# Patient Record
Sex: Male | Born: 1935 | Race: White | Hispanic: No | Marital: Married | Smoking: Former smoker
Health system: Southern US, Community
[De-identification: ages and names within clinical notes are randomized; demographics above are authoritative.]

## PROBLEM LIST (undated history)

## (undated) DIAGNOSIS — J45909 Unspecified asthma, uncomplicated: Secondary | ICD-10-CM

## (undated) DIAGNOSIS — I10 Essential (primary) hypertension: Secondary | ICD-10-CM

## (undated) DIAGNOSIS — I2510 Atherosclerotic heart disease of native coronary artery without angina pectoris: Secondary | ICD-10-CM

## (undated) DIAGNOSIS — N400 Enlarged prostate without lower urinary tract symptoms: Secondary | ICD-10-CM

## (undated) DIAGNOSIS — I219 Acute myocardial infarction, unspecified: Secondary | ICD-10-CM

## (undated) DIAGNOSIS — I5020 Unspecified systolic (congestive) heart failure: Secondary | ICD-10-CM

## (undated) DIAGNOSIS — J449 Chronic obstructive pulmonary disease, unspecified: Secondary | ICD-10-CM

## (undated) HISTORY — PX: CORONARY ARTERY BYPASS GRAFT: SHX141

---

## 2004-02-28 ENCOUNTER — Ambulatory Visit: Payer: Self-pay | Admitting: Gastroenterology

## 2007-12-16 ENCOUNTER — Ambulatory Visit: Payer: Self-pay | Admitting: Internal Medicine

## 2009-05-13 IMAGING — US US PELVIS LIMITED
1 series · 4 of 4 positions shown · non-contrast
Comparison: none

REASON FOR EXAM: Dysuria, bladder retention
COMMENTS:

[Series 1: us pelvis limited · 4 of 4 slices shown]
[im 1/4]
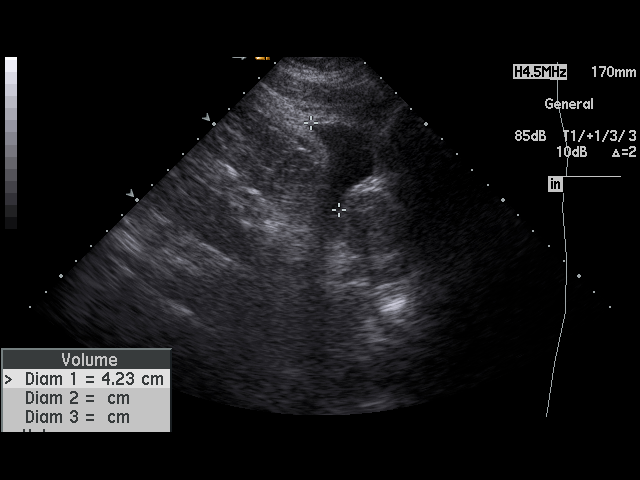
[im 2/4]
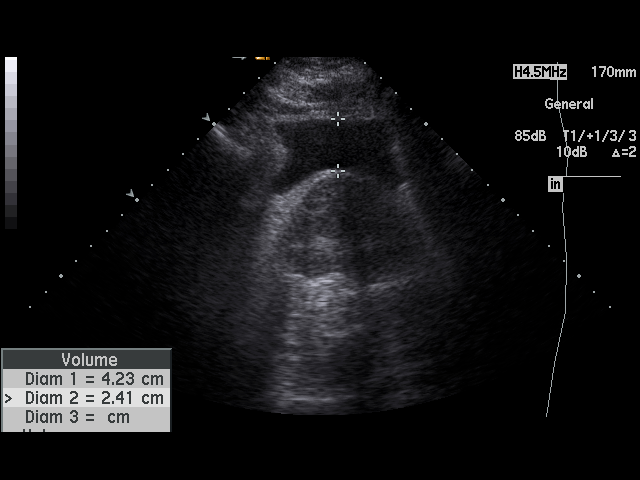
[im 3/4]
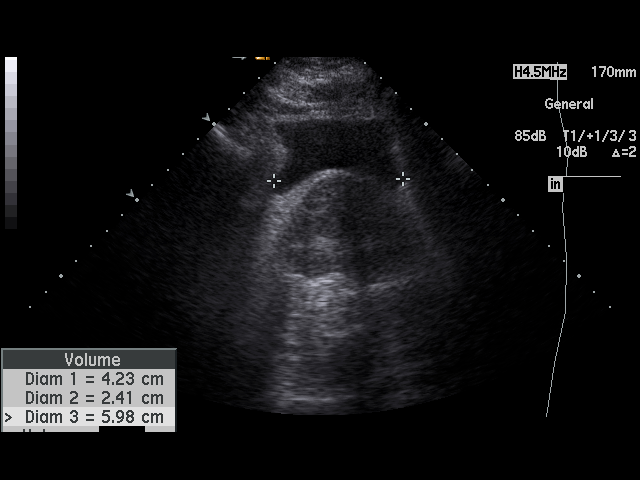
[im 4/4]
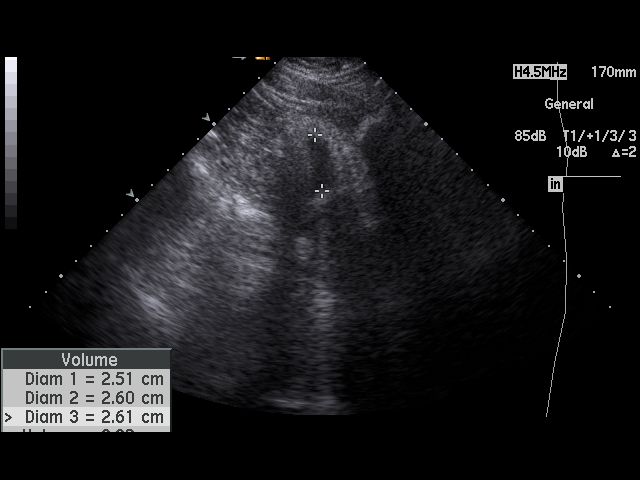

[4 of 4 positions shown; findings below may reference images not displayed]

PROCEDURE:     US  - US URINARY BLADDER  - December 16, 2007  [DATE]

RESULT:     The urinary bladder was not full. The patient reported having
drunk 32 ounces of fluid and consequently the exam was delayed 30 minutes
for the bladder to fill. At that time, the bladder is still noted to be
incompletely filled but the patient did not have additional time to wait.
Bladder volume (pre-void) measured 31.92 cubic centimeters. Post void volume
of the urinary bladder measured 8.92 cubic centimeters. No significant
abnormalities of the partially filled urinary bladder wall are seen.
IMPRESSION: Please see above.

## 2009-08-08 ENCOUNTER — Ambulatory Visit: Payer: Self-pay | Admitting: Cardiology

## 2012-07-11 ENCOUNTER — Emergency Department: Payer: Self-pay | Admitting: Emergency Medicine

## 2013-10-08 ENCOUNTER — Ambulatory Visit: Payer: Self-pay | Admitting: Gastroenterology

## 2019-12-06 ENCOUNTER — Other Ambulatory Visit: Payer: Self-pay

## 2019-12-06 ENCOUNTER — Other Ambulatory Visit: Payer: Self-pay | Admitting: Cardiology

## 2019-12-06 ENCOUNTER — Other Ambulatory Visit
Admission: RE | Admit: 2019-12-06 | Discharge: 2019-12-06 | Disposition: A | Discharge status: Home / Self Care | Payer: Medicare Other | Source: Ambulatory Visit | Attending: Internal Medicine | Admitting: Internal Medicine

## 2019-12-06 DIAGNOSIS — Z20822 Contact with and (suspected) exposure to covid-19: Secondary | ICD-10-CM | POA: Insufficient documentation

## 2019-12-06 DIAGNOSIS — Z01818 Encounter for other preprocedural examination: Secondary | ICD-10-CM | POA: Insufficient documentation

## 2019-12-06 DIAGNOSIS — I25708 Atherosclerosis of coronary artery bypass graft(s), unspecified, with other forms of angina pectoris: Secondary | ICD-10-CM

## 2019-12-06 LAB — SARS CORONAVIRUS 2 (TAT 6-24 HRS): SARS Coronavirus 2: NEGATIVE

## 2019-12-06 MED ORDER — SODIUM CHLORIDE 0.9% FLUSH: 3.0000 mL | Freq: Two times a day (BID) | INTRAVENOUS | Status: DC

## 2019-12-08 ENCOUNTER — Other Ambulatory Visit: Payer: Self-pay

## 2019-12-08 ENCOUNTER — Encounter: Payer: Self-pay | Admitting: Internal Medicine

## 2019-12-08 ENCOUNTER — Observation Stay
Admission: RE | Admit: 2019-12-08 | Discharge: 2019-12-09 | Disposition: A | Discharge status: Home / Self Care | Payer: Medicare Other | Attending: Internal Medicine | Admitting: Internal Medicine

## 2019-12-08 ENCOUNTER — Encounter
Admission: RE | Disposition: A | Discharge status: Home / Self Care | Payer: Self-pay | Source: Home / Self Care | Attending: Internal Medicine

## 2019-12-08 DIAGNOSIS — I25119 Atherosclerotic heart disease of native coronary artery with unspecified angina pectoris: Principal | ICD-10-CM | POA: Insufficient documentation

## 2019-12-08 DIAGNOSIS — I25708 Atherosclerosis of coronary artery bypass graft(s), unspecified, with other forms of angina pectoris: Secondary | ICD-10-CM

## 2019-12-08 DIAGNOSIS — Z79899 Other long term (current) drug therapy: Secondary | ICD-10-CM | POA: Insufficient documentation

## 2019-12-08 DIAGNOSIS — I119 Hypertensive heart disease without heart failure: Secondary | ICD-10-CM | POA: Diagnosis not present

## 2019-12-08 DIAGNOSIS — R079 Chest pain, unspecified: Secondary | ICD-10-CM

## 2019-12-08 DIAGNOSIS — I255 Ischemic cardiomyopathy: Secondary | ICD-10-CM | POA: Insufficient documentation

## 2019-12-08 DIAGNOSIS — Z87891 Personal history of nicotine dependence: Secondary | ICD-10-CM | POA: Diagnosis not present

## 2019-12-08 DIAGNOSIS — J45909 Unspecified asthma, uncomplicated: Secondary | ICD-10-CM | POA: Diagnosis not present

## 2019-12-08 DIAGNOSIS — R0789 Other chest pain: Secondary | ICD-10-CM | POA: Diagnosis present

## 2019-12-08 DIAGNOSIS — J449 Chronic obstructive pulmonary disease, unspecified: Secondary | ICD-10-CM | POA: Insufficient documentation

## 2019-12-08 DIAGNOSIS — E785 Hyperlipidemia, unspecified: Secondary | ICD-10-CM | POA: Diagnosis not present

## 2019-12-08 DIAGNOSIS — Z955 Presence of coronary angioplasty implant and graft: Secondary | ICD-10-CM

## 2019-12-08 HISTORY — DX: Benign prostatic hyperplasia without lower urinary tract symptoms: N40.0

## 2019-12-08 HISTORY — DX: Essential (primary) hypertension: I10

## 2019-12-08 HISTORY — DX: Unspecified asthma, uncomplicated: J45.909

## 2019-12-08 HISTORY — PX: LEFT HEART CATH AND CORONARY ANGIOGRAPHY: CATH118249

## 2019-12-08 HISTORY — DX: Chronic obstructive pulmonary disease, unspecified: J44.9

## 2019-12-08 HISTORY — PX: CORONARY STENT INTERVENTION: CATH118234

## 2019-12-08 LAB — POCT ACTIVATED CLOTTING TIME: Activated Clotting Time: 318 seconds

## 2019-12-08 SURGERY — LEFT HEART CATH AND CORONARY ANGIOGRAPHY
Anesthesia: Moderate Sedation

## 2019-12-08 MED ORDER — ASPIRIN 81 MG PO CHEW
CHEWABLE_TABLET | ORAL | Status: DC | PRN
  Administered 2019-12-08: 243 mg via ORAL

## 2019-12-08 MED ORDER — HYDROCHLOROTHIAZIDE 25 MG PO TABS: 25.0000 mg | ORAL_TABLET | Freq: Every day | ORAL | Status: DC

## 2019-12-08 MED ORDER — HYDRALAZINE HCL 20 MG/ML IJ SOLN: 10.0000 mg | INTRAMUSCULAR | Status: AC | PRN

## 2019-12-08 MED ORDER — SODIUM CHLORIDE 0.9 % IV SOLN: 250.0000 mL | INTRAVENOUS | Status: DC | PRN

## 2019-12-08 MED ORDER — METOPROLOL SUCCINATE ER 50 MG PO TB24: 25.0000 mg | ORAL_TABLET | Freq: Every day | ORAL | Status: DC

## 2019-12-08 MED ORDER — SODIUM CHLORIDE 0.9 % IV SOLN
INTRAVENOUS | Status: AC | PRN
  Administered 2019-12-08: 1.75 mg/kg/h via INTRAVENOUS

## 2019-12-08 MED ORDER — BIVALIRUDIN TRIFLUOROACETATE 250 MG IV SOLR
INTRAVENOUS | Status: AC
  Filled 2019-12-08: qty 250

## 2019-12-08 MED ORDER — SODIUM CHLORIDE 0.9% FLUSH: 3.0000 mL | INTRAVENOUS | Status: DC | PRN

## 2019-12-08 MED ORDER — PANTOPRAZOLE SODIUM 40 MG IV SOLR
40.0000 mg | Freq: Two times a day (BID) | INTRAVENOUS | Status: DC
  Filled 2019-12-08 (×4): qty 40

## 2019-12-08 MED ORDER — PANTOPRAZOLE SODIUM 40 MG IV SOLR: 40.0000 mg | Freq: Two times a day (BID) | INTRAVENOUS | Status: DC

## 2019-12-08 MED ORDER — FENTANYL CITRATE (PF) 100 MCG/2ML IJ SOLN
INTRAMUSCULAR | Status: DC | PRN
Start: 2019-12-08 — End: 2019-12-08
  Administered 2019-12-08: 50 ug via INTRAVENOUS
  Administered 2019-12-08: 25 ug via INTRAVENOUS

## 2019-12-08 MED ORDER — CLOPIDOGREL BISULFATE 75 MG PO TABS
75.0000 mg | ORAL_TABLET | Freq: Every day | ORAL | Status: DC
  Administered 2019-12-09: 75 mg via ORAL
  Filled 2019-12-08: qty 1

## 2019-12-08 MED ORDER — PANTOPRAZOLE SODIUM 40 MG IV SOLR
40.0000 mg | Freq: Two times a day (BID) | INTRAVENOUS | Status: DC
  Administered 2019-12-08: 40 mg via INTRAVENOUS
  Filled 2019-12-08: qty 40

## 2019-12-08 MED ORDER — LABETALOL HCL 5 MG/ML IV SOLN: 10.0000 mg | INTRAVENOUS | Status: AC | PRN

## 2019-12-08 MED ORDER — BIVALIRUDIN BOLUS VIA INFUSION - CUPID
INTRAVENOUS | Status: DC | PRN
  Administered 2019-12-08: 52.725 mg via INTRAVENOUS

## 2019-12-08 MED ORDER — SODIUM CHLORIDE 0.9 % WEIGHT BASED INFUSION: 1.0000 mL/kg/h | INTRAVENOUS | Status: DC

## 2019-12-08 MED ORDER — ASPIRIN 81 MG PO CHEW: 81.0000 mg | CHEWABLE_TABLET | ORAL | Status: DC

## 2019-12-08 MED ORDER — IOHEXOL 300 MG/ML  SOLN
INTRAMUSCULAR | Status: DC | PRN
  Administered 2019-12-08: 250 mL

## 2019-12-08 MED ORDER — HEPARIN (PORCINE) IN NACL 1000-0.9 UT/500ML-% IV SOLN
INTRAVENOUS | Status: AC
  Filled 2019-12-08: qty 1000

## 2019-12-08 MED ORDER — ALBUTEROL SULFATE HFA 108 (90 BASE) MCG/ACT IN AERS
2.0000 | INHALATION_SPRAY | Freq: Four times a day (QID) | RESPIRATORY_TRACT | Status: DC | PRN
  Filled 2019-12-08: qty 6.7

## 2019-12-08 MED ORDER — MIDAZOLAM HCL 2 MG/2ML IJ SOLN
INTRAMUSCULAR | Status: AC
  Filled 2019-12-08: qty 2

## 2019-12-08 MED ORDER — MIDAZOLAM HCL 2 MG/2ML IJ SOLN
INTRAMUSCULAR | Status: DC | PRN
  Administered 2019-12-08: 1 mg via INTRAVENOUS
  Administered 2019-12-08: 0.5 mg via INTRAVENOUS

## 2019-12-08 MED ORDER — ONDANSETRON HCL 4 MG/2ML IJ SOLN: 4.0000 mg | Freq: Four times a day (QID) | INTRAMUSCULAR | Status: DC | PRN

## 2019-12-08 MED ORDER — HEPARIN (PORCINE) IN NACL 1000-0.9 UT/500ML-% IV SOLN
INTRAVENOUS | Status: DC | PRN
  Administered 2019-12-08: 500 mL

## 2019-12-08 MED ORDER — FENTANYL CITRATE (PF) 100 MCG/2ML IJ SOLN
INTRAMUSCULAR | Status: AC
  Filled 2019-12-08: qty 2

## 2019-12-08 MED ORDER — ASPIRIN 81 MG PO CHEW
CHEWABLE_TABLET | ORAL | Status: AC
  Filled 2019-12-08: qty 3

## 2019-12-08 MED ORDER — ASPIRIN 81 MG PO CHEW: 81.0000 mg | CHEWABLE_TABLET | Freq: Every day | ORAL | Status: DC

## 2019-12-08 MED ORDER — HYDRALAZINE HCL 20 MG/ML IJ SOLN
INTRAMUSCULAR | Status: DC | PRN
  Administered 2019-12-08: 10 mg via INTRAVENOUS

## 2019-12-08 MED ORDER — ASPIRIN 81 MG PO CHEW
CHEWABLE_TABLET | ORAL | Status: AC
  Filled 2019-12-08: qty 1

## 2019-12-08 MED ORDER — SODIUM CHLORIDE 0.9 % IV SOLN: INTRAVENOUS | Status: DC

## 2019-12-08 MED ORDER — LOSARTAN POTASSIUM 50 MG PO TABS: 100.0000 mg | ORAL_TABLET | Freq: Every day | ORAL | Status: DC

## 2019-12-08 MED ORDER — ACETAMINOPHEN 325 MG PO TABS: 650.0000 mg | ORAL_TABLET | ORAL | Status: DC | PRN

## 2019-12-08 MED ORDER — SODIUM CHLORIDE 0.9 % IV SOLN
0.2500 mg/kg/h | INTRAVENOUS | Status: AC
  Filled 2019-12-08: qty 250

## 2019-12-08 MED ORDER — CLOPIDOGREL BISULFATE 75 MG PO TABS
ORAL_TABLET | ORAL | Status: AC
  Filled 2019-12-08: qty 8

## 2019-12-08 MED ORDER — HYDRALAZINE HCL 20 MG/ML IJ SOLN
INTRAMUSCULAR | Status: AC
  Filled 2019-12-08: qty 1

## 2019-12-08 MED ORDER — ISOSORBIDE MONONITRATE ER 60 MG PO TB24
60.0000 mg | ORAL_TABLET | Freq: Every day | ORAL | Status: DC
  Administered 2019-12-08: 60 mg via ORAL
  Filled 2019-12-08: qty 1

## 2019-12-08 MED ORDER — CLOPIDOGREL BISULFATE 75 MG PO TABS
ORAL_TABLET | ORAL | Status: DC | PRN
  Administered 2019-12-08: 600 mg via ORAL

## 2019-12-08 MED ORDER — SODIUM CHLORIDE 0.9% FLUSH: 3.0000 mL | Freq: Two times a day (BID) | INTRAVENOUS | Status: DC

## 2019-12-08 SURGICAL SUPPLY — 19 items
BALLN TREK RX 2.25X20 (BALLOONS) ×4
BALLOON TREK RX 2.25X20 (BALLOONS) ×2 IMPLANT
CATH INFINITI 5FR JL4 (CATHETERS) ×4 IMPLANT
CATH INFINITI JR4 5F (CATHETERS) ×4 IMPLANT
CATH VISTA GUIDE 6FR JR4 SH (CATHETERS) ×4 IMPLANT
DEVICE CLOSURE MYNXGRIP 6/7F (Vascular Products) ×4 IMPLANT
KIT ENCORE 26 ADVANTAGE (KITS) ×4 IMPLANT
NEEDLE PERC 18GX7CM (NEEDLE) ×4 IMPLANT
PACK CARDIAC CATH (CUSTOM PROCEDURE TRAY) ×4 IMPLANT
PROTECTION STATION PRESSURIZED (MISCELLANEOUS) ×4
SET ATX SIMPLICITY (MISCELLANEOUS) ×4 IMPLANT
SHEATH AVANTI 5FR X 11CM (SHEATH) ×4 IMPLANT
SHEATH AVANTI 6FR X 11CM (SHEATH) ×4 IMPLANT
STATION PROTECTION PRESSURIZED (MISCELLANEOUS) ×2 IMPLANT
STENT RESOLUTE ONYX 2.25X18 (Permanent Stent) ×4 IMPLANT
STENT RESOLUTE ONYX 2.25X30 (Permanent Stent) ×4 IMPLANT
STENT RESOLUTE ONYX 2.25X38 (Permanent Stent) IMPLANT
WIRE G HI TQ BMW 190 (WIRE) ×4 IMPLANT
WIRE GUIDERIGHT .035X150 (WIRE) ×4 IMPLANT

## 2019-12-08 NOTE — Progress Notes (Signed)
Dr. Juliann Pares spoke at length at bedside to patient and daughter:  Jesus Stuart. Reviewed procedure, follow up care. Patient and daughter with many questions, all answered by Dr. Juliann Pares.

## 2019-12-08 NOTE — Progress Notes (Signed)
   12/08/19 1852  Assess: MEWS Score  Temp 97.6 F (36.4 C)  BP (!) 163/75  Pulse Rate (!) 51  Resp 16  SpO2 100 %  O2 Device Room Air   Pt arrived to room 255. Pt made aware that he has to lay flat until 1900 and can not get up until 2000.

## 2019-12-08 NOTE — Progress Notes (Signed)
Dr Juliann Pares sent a message to charge RN Allyson with this RN present that pt can sit up 1 hour after angiomax completed and OOB 2 hours post. Angiomax stopped at 1800. Report given to floor that pt will be able to sit up at 1900 and OOB at 2000.

## 2019-12-08 NOTE — Progress Notes (Signed)
Per Dr. Encarnacion Chu cath x1

## 2019-12-08 NOTE — Progress Notes (Signed)
Report called to [place] RN 2A.  When angiomax is completed pt is able to be transferred.  Patient will need to continue to stay flat for 2hrs after.

## 2019-12-09 ENCOUNTER — Encounter: Payer: Self-pay | Admitting: Internal Medicine

## 2019-12-09 DIAGNOSIS — I25119 Atherosclerotic heart disease of native coronary artery with unspecified angina pectoris: Secondary | ICD-10-CM | POA: Diagnosis not present

## 2019-12-09 LAB — URINALYSIS, COMPLETE (UACMP) WITH MICROSCOPIC
Bacteria, UA: NONE SEEN
RBC / HPF: >50 RBC/hpf (ref 0–5)
Specific Gravity, Urine: >1.035 — ABNORMAL HIGH (ref 1.005–1.030)
Squamous Epithelial / HPF: NONE SEEN (ref 0–5)
WBC, UA: NONE SEEN WBC/hpf (ref 0–5)

## 2019-12-09 LAB — BASIC METABOLIC PANEL
Anion gap: 10 (ref 5–15)
BUN: 18 mg/dL (ref 8–23)
CO2: 23 mmol/L (ref 22–32)
Calcium: 8.8 mg/dL — ABNORMAL LOW (ref 8.9–10.3)
Chloride: 104 mmol/L (ref 98–111)
Creatinine, Ser: 1.13 mg/dL (ref 0.61–1.24)
GFR, Estimated: >60 mL/min (ref >60)
Glucose, Bld: 111 mg/dL — ABNORMAL HIGH (ref 70–99)
Potassium: 4.3 mmol/L (ref 3.5–5.1)
Sodium: 137 mmol/L (ref 135–145)

## 2019-12-09 MED ORDER — ATORVASTATIN CALCIUM 20 MG PO TABS: 40.0000 mg | ORAL_TABLET | Freq: Every day | ORAL | Status: DC

## 2019-12-09 MED ORDER — ATORVASTATIN CALCIUM 40 MG PO TABS
40.0000 mg | ORAL_TABLET | Freq: Every day | ORAL | 11 refills | Status: DC
Start: 2019-12-09 — End: 2023-06-30

## 2019-12-09 NOTE — H&P (Signed)
Chief Complaint: Chief Complaint  Patient presents with  . 4 week follow up  doing better  Date of Service: 12/01/2019 Date of Birth: 1931-01-24 PCP: Allyn Kenner, MD   History of Present Illness: Mr. Jesus Stuart is a 86 y.o.male patient with a past medical history significant for coronary artery disease s/p bypass, status post coronary bypass grafting x4 with a LIMA to the LAD, saphenous vein graft to ramus intermedius, saphenous vein graft OM 2, saphenous vein graft to distal RCA per Dr. Santina Evans in 1997, ischemic cardiomyopathy with an ejection fraction of 40%, COPD, and hypertension who presents for a follow up visit. He has been noting progressive exertional chest discomfort. Functional study done recently showed anteroapical largely fixed but some reversibility. He has dramatically had to reduce his activity due to chest discomfort which resolves with rest and recent reoccurs with more activity. Echocardiogram showed reduced LV function of 40% which was similar to previous ejection fractions. There was apical akinesis but no obvious thrombus noted. There was no sustained tachycardia or bradycardia arrhythmia although the patient did have runs of SVT on a Holter monitor. Risk benefits of left heart cath were discussed due to progressive symptoms despite attempt at medical management.  Past Medical and Surgical History  Past Medical History Past Medical History:  Diagnosis Date  . Asthma without status asthmaticus, unspecified  . Atherosclerotic heart disease native coronary artery w/angina pectoris (CMS-HCC)  Recent cardiac cath 06/20/95 showed an EF of 49% with anterior and apical akinesis. 50% proximal  . BPH (benign prostatic hypertrophy)  . Chronic diarrhea, unspecified  Evaluation per Dr. Henrene Hawking showed no significant abnormalities thus far  . COPD (chronic obstructive pulmonary disease) (CMS-HCC)  . Diverticulosis 10/08/13  . Heart attack (CMS-HCC) 1989  .  Hypertensive cardiovascular disease  . Ischemic cardiomyopathy  . Reactive airway disease  . Systemic primary arterial hypertension   Past Surgical History He has a past surgical history that includes Coronary artery bypass graft (1997); Cholecystectomy; Colonoscopy (02/03/1996, 02/28/2004); Cardiac cath (06/20/1995); Angioplasty, s/p MI (1989); and Colonoscopy (10/08/13).   Medications and Allergies  Current Medications  Current Outpatient Medications  Medication Sig Dispense Refill  . aspirin 81 MG EC tablet Take 81 mg by mouth once daily.  . budesonide-formoterol (SYMBICORT) 80-4.5 mcg/actuation inhaler Inhale 2 inhalations into the lungs 2 (two) times daily as needed.  . clopidogreL (PLAVIX) 75 mg tablet TAKE 1 TABLET BY MOUTH DAILY 90 tablet 1  . isosorbide mononitrate (IMDUR) 60 MG ER tablet Take 1 tablet (60 mg total) by mouth once daily 30 tablet 11  . losartan-hydrochlorothiazide (HYZAAR) 100-25 mg tablet TAKE 1 TABLET BY MOUTH DAILY 90 tablet 1  . metoprolol succinate (TOPROL-XL) 25 MG XL tablet Take 1 tablet (25 mg total) by mouth once daily (Patient taking differently: Take 12.5 mg by mouth once daily ) 30 tablet 11  . nitroGLYcerin (NITROSTAT) 0.4 MG SL tablet PLACE 1 TABLET UNDER THE TONGUE EVERY 5 MINS AS NEEDED FOR CHEST PAIN MAY TAKE UP TO 3 DOSES 25 tablet 2  . pantoprazole (PROTONIX) 40 MG DR tablet Take 1 tablet (40 mg total) by mouth 2 (two) times daily before meals 180 tablet 11  . PROAIR HFA 90 mcg/actuation inhaler 9   No current facility-administered medications for this visit.   Allergies: Penicillin g and Statins-hmg-coa reductase inhibitors  Social and Family History  Social History reports that he quit smoking about 61 years ago. He has never used smokeless tobacco. He reports current alcohol  use. He reports that he does not use drugs.  Family History Family History  Problem Relation Age of Onset  . Stroke Mother   Review of Systems   Review of  Systems: The patient denies chest pain, shortness of breath, orthopnea, paroxysmal nocturnal dyspnea, pedal edema, palpitations, heart racing, syncope, leg pain, leg cramping. Does have occasional dizziness when standing up quickly along with lightheadedness. Border line presyncope. Review of 12 Systems is negative except as described in HPI.   Physical Examination   Vitals:BP 120/64  Pulse 91  Resp 16  Ht 175.3 cm (5\' 9" )  Wt 73 kg (161 lb)  SpO2 97%  BMI 23.78 kg/m  Ht:175.3 cm (5\' 9" ) Wt:73 kg (161 lb) surface area is 1.89 meters squared. Body mass index is 23.78 kg/m.  General: Well developed, well nourished. In no acute distress HEENT: Pupils equally reactive to light and accomodation  Neck: Supple without thyromegaly, or goiter. Carotid pulses 2+. No carotid bruits present.  Pulmonary: Clear to auscultation bilaterally; no wheezes, rales, rhonchi Cardiovascular: Regular rate and rhythm. No gallops, murmurs or rubs Gastrointestinal: Soft nontender, nondistended, with normal bowel sounds Extremities: No cyanosis, clubbing, or edema Peripheral Pulses: 2+ in upper extremities, 2+ in lower extremities  Neurology: Alert and oriented X3 Pysch: Good affect. Responds appropriately  Assessment   86 y.o. male with  1. Cardiomyopathy, ischemic-EF 40% by recent echo. This is been chronic. No recent change. We discussed the importance of a low-sodium diet as well as compliance with his current medical regimen including spironolactone 25 mg daily, metoprolol succinate 25 mg daily, losartan-hydrochlorothiazide 100-25 mg daily. Will continue with this regimen and follow-up for further symptoms.  2. Coronary artery disease involving native coronary artery of native heart with angina pectoris (CMS-HCC)-status post coronary artery bypass grafting. Currently on aspirin and Plavix along with metoprolol. We will continue with this regimen and follow. Symptoms of not improved with increasing  nitrates. Limited by blood pressure. Risk and benefits of left heart cath were explained. Plan to proceed with left heart cath per Dr. FXT:KWIO. There will be further recommendations after this is completed.  3. Chronic bronchitis, unspecified chronic bronchitis type (CMS-HCC)-bronchodilators as needed.  4. Benign hypertension with CKD (chronic kidney disease) stage III (CMS-HCC)-reviewed labs done by his primary care physician. Creatinine increased however still has a GFR of 48. We will continue to follow. Hydration was recommended.  5. Resting tremor-stable  6. Dizzy-orthostatic some extent. Has improved since altering his antihypertensive regimen. We will continue to follow for worsening. No significant rhythm causing his symptoms thus far.   Plan  1. Coronary artery disease s/p bypass -Continue aspirin 81mg  daily and Plavix -Continue losartan-HCTZ 100-25mg  daily. LDL of less than 100 is recommended. Patient is statin intolerant. No clinical or functional study evidence of ischemia. We will continue with current medical regimen but proceed with cardiac cath to better evaluate etiology of his symptoms.  2. Hypertension  -We will remain on metoprolol succinate 25 mg daily as well as losartan-hydrochlorothiazide and spironolactone and follow. Follow for orthostatic blood pressure.  -Low sodium diet encouraged  3. COPD -Continue with bronchodilators and routine follow up with PCP  4. Ischemic cardiomyopathy -Continue current medication regimen except for the above changes. -Will continue to follow. EF 40%. 5. Erectile dysfunction -Sildenafil 20mg  as needed; do not use Nitro, however have instructed how to use this.

## 2019-12-09 NOTE — Final Progress Note (Signed)
Physician Final Progress Note  Patient ID: FORREST JAROSZEWSKI MRN: [id number] DOB/AGE: 1930/09/25 86 y.o.  Admit date: 12/08/2019 Admitting provider: Alwyn Pea, MD Discharge date: 12/09/2019   Admission Diagnoses: Coronary artery disease  Discharge Diagnoses:  Active Problems:   S/P drug eluting coronary stent placement Hyperlipidemia, coronary artery disease  Consults: None  Significant Findings/ Diagnostic Studies: Coronary artery disease  Procedures: Left heart cath with PCI of the RCA with drug-eluting stents x2  Discharge Condition: good  Disposition:   Diet: Cardiac diet  Discharge Activity: No lifting, driving, or strenuous exercise for 4 days  Discharge Instructions    AMB Referral to Cardiac Rehabilitation - Phase II   Complete by: As directed    Diagnosis: Coronary Stents   After initial evaluation and assessments completed: Virtual Based Care may be provided alone or in conjunction with Phase 2 Cardiac Rehab based on patient barriers.: Yes     Allergies as of 12/09/2019      Reactions   Penicillin G Rash      Medication List    TAKE these medications   albuterol 108 (90 Base) MCG/ACT inhaler Commonly known as: VENTOLIN HFA Inhale 1-2 puffs into the lungs every 6 (six) hours as needed for wheezing or shortness of breath.   aspirin EC 81 MG tablet Take 81 mg by mouth at bedtime. Swallow whole.   atorvastatin 40 MG tablet Commonly known as: LIPITOR Take 1 tablet (40 mg total) by mouth daily.   budesonide-formoterol 160-4.5 MCG/ACT inhaler Commonly known as: SYMBICORT Inhale 2 puffs into the lungs 2 (two) times daily as needed (respiratory issues.).   clopidogrel 75 MG tablet Commonly known as: PLAVIX Take 75 mg by mouth at bedtime.   isosorbide mononitrate 60 MG 24 hr tablet Commonly known as: IMDUR Take 60 mg by mouth every evening.   losartan-hydrochlorothiazide 100-25 MG tablet Commonly known as: HYZAAR Take 1 tablet by mouth  every evening.   metoprolol succinate 25 MG 24 hr tablet Commonly known as: TOPROL-XL Take 25 mg by mouth every evening.   nitroGLYCERIN 0.4 MG SL tablet Commonly known as: NITROSTAT Place 0.4 mg under the tongue every 5 (five) minutes x 3 doses as needed for chest pain.   pantoprazole 40 MG tablet Commonly known as: PROTONIX Take 40 mg by mouth at bedtime.       Follow-up Information    Dalia Heading, MD Follow up in 1 week(s).   Specialty: Cardiology Contact information: [address] [place] [address] [phone]               Total time spent taking care of this patient: 30 minutes  Signed: Dalia Heading 12/09/2019, 8:30 AM

## 2019-12-09 NOTE — Discharge Summary (Signed)
Physician Discharge Summary  [name] MRN: [id number] DOB/AGE: 84/84/1932 84 y.o.  Admit date: 12/08/2019 Discharge date: 12/09/2019  Primary Discharge Diagnosis coronary artery disease Secondary Discharge Diagnosis hyperlipidemia  Significant Diagnostic Studies: angiography: cardiac cath and pci of rca and yes  Consults: None  Hospital Course: Patient with history of coronary artery disease status post coronary artery bypass grafting with development of worsening exertional chest pain.  Attempted medical management was unsuccessful.  Patient was scheduled for outpatient cardiac catheterization.  This revealed occluded vein graft to RCA.  Patent LIMA to the LAD.  Underwent PCI with overlapping drug-eluting stents in the native RCA.  Was monitored post PCI and has done well.  No chest pain.  Cath site appears clean and dry.  Distal pulses intact.   Discharge Exam: Blood pressure 132/65, pulse (!) 47, temperature 97.7 F (36.5 C), temperature source Oral, resp. rate 18, height 5\' 9"  (1.753 m), weight 72.2 kg, SpO2 99 %.   General appearance: alert and cooperative Head: Normocephalic, without obvious abnormality, atraumatic Resp: clear to auscultation bilaterally Cardio: regular rate and rhythm, S1, S2 normal, no murmur, click, rub or gallop GI: soft, non-tender; bowel sounds normal; no masses,  no organomegaly Extremities: extremities normal, atraumatic, no cyanosis or edema Pulses: 2+ and symmetric Neurologic: Grossly normal Incision/Wound: Labs:  No results found for: WBC, HGB, HCT, MCV, PLT No results for input(s): NA, K, CL, CO2, BUN, CREATININE, CALCIUM, PROT, BILITOT, ALKPHOS, ALT, AST, GLUCOSE in the last 168 hours.  Invalid input(s): LABALBU    Radiology:   EKG: Normal sinus rhythm with no ischemia  FOLLOW UP PLANS AND APPOINTMENTS Discharge Instructions    AMB Referral to Cardiac Rehabilitation - Phase II   Complete by: As directed     Diagnosis: Coronary Stents   After initial evaluation and assessments completed: Virtual Based Care may be provided alone or in conjunction with Phase 2 Cardiac Rehab based on patient barriers.: Yes     Allergies as of 12/09/2019      Reactions   Penicillin G Rash      Medication List    TAKE these medications   albuterol 108 (90 Base) MCG/ACT inhaler Commonly known as: VENTOLIN HFA Inhale 1-2 puffs into the lungs every 6 (six) hours as needed for wheezing or shortness of breath.   aspirin EC 81 MG tablet Take 81 mg by mouth at bedtime. Swallow whole.   atorvastatin 40 MG tablet Commonly known as: LIPITOR Take 1 tablet (40 mg total) by mouth daily.   budesonide-formoterol 160-4.5 MCG/ACT inhaler Commonly known as: SYMBICORT Inhale 2 puffs into the lungs 2 (two) times daily as needed (respiratory issues.).   clopidogrel 75 MG tablet Commonly known as: PLAVIX Take 75 mg by mouth at bedtime.   isosorbide mononitrate 60 MG 24 hr tablet Commonly known as: IMDUR Take 60 mg by mouth every evening.   losartan-hydrochlorothiazide 100-25 MG tablet Commonly known as: HYZAAR Take 1 tablet by mouth every evening.   metoprolol succinate 25 MG 24 hr tablet Commonly known as: TOPROL-XL Take 25 mg by mouth every evening.   nitroGLYCERIN 0.4 MG SL tablet Commonly known as: NITROSTAT Place 0.4 mg under the tongue every 5 (five) minutes x 3 doses as needed for chest pain.   pantoprazole 40 MG tablet Commonly known as: PROTONIX Take 40 mg by mouth at bedtime.       Follow-up Information    12/11/2019, MD Follow up in 1 week(s).  Specialty: Cardiology Contact information: [address] [place] [address] [phone]               BRING ALL MEDICATIONS WITH YOU TO FOLLOW UP APPOINTMENTS  Time spent with patient to include physician time: 30 minutes Signed:  Dalia Heading MD, [facility] 12/09/2019, 8:27 AM

## 2019-12-09 NOTE — Progress Notes (Signed)
Pt ambulated around nurses station x1 and tolerated well. No distress noted.

## 2019-12-09 NOTE — Progress Notes (Signed)
Jesus Stuart to be D/C'd Home per MD order.  Discussed prescriptions and follow up appointments with the patient. Prescriptions given to patient, medication list explained in detail. Pt verbalized understanding.  Allergies as of 12/09/2019      Reactions   Penicillin G Rash      Medication List    TAKE these medications   albuterol 108 (90 Base) MCG/ACT inhaler Commonly known as: VENTOLIN HFA Inhale 1-2 puffs into the lungs every 6 (six) hours as needed for wheezing or shortness of breath.   aspirin EC 81 MG tablet Take 81 mg by mouth at bedtime. Swallow whole.   atorvastatin 40 MG tablet Commonly known as: LIPITOR Take 1 tablet (40 mg total) by mouth daily.   budesonide-formoterol 160-4.5 MCG/ACT inhaler Commonly known as: SYMBICORT Inhale 2 puffs into the lungs 2 (two) times daily as needed (respiratory issues.).   clopidogrel 75 MG tablet Commonly known as: PLAVIX Take 75 mg by mouth at bedtime.   isosorbide mononitrate 60 MG 24 hr tablet Commonly known as: IMDUR Take 60 mg by mouth every evening.   losartan-hydrochlorothiazide 100-25 MG tablet Commonly known as: HYZAAR Take 1 tablet by mouth every evening.   metoprolol succinate 25 MG 24 hr tablet Commonly known as: TOPROL-XL Take 25 mg by mouth every evening.   nitroGLYCERIN 0.4 MG SL tablet Commonly known as: NITROSTAT Place 0.4 mg under the tongue every 5 (five) minutes x 3 doses as needed for chest pain.   pantoprazole 40 MG tablet Commonly known as: PROTONIX Take 40 mg by mouth at bedtime.       Vitals:   12/09/19 0807 12/09/19 1115  BP: 132/65 (!) 144/62  Pulse: (!) 47 (!) 54  Resp: 18 18  Temp: 97.7 F (36.5 C) 98.6 F (37 C)  SpO2: 99% 100%    Tele box removed and returned. Skin clean, dry and intact without evidence of skin break down, no evidence of skin tears noted. IV catheter discontinued intact. Site without signs and symptoms of complications. Dressing and pressure applied. Pt  denies pain at this time. No complaints noted.  An After Visit Summary was printed and given to the patient. Patient escorted via WC, and D/C home via private auto.  Rigoberto Noel

## 2019-12-13 ENCOUNTER — Emergency Department: Payer: Medicare Other

## 2019-12-13 ENCOUNTER — Other Ambulatory Visit: Payer: Self-pay

## 2019-12-13 ENCOUNTER — Inpatient Hospital Stay
Admission: EM | Admit: 2019-12-13 | Discharge: 2019-12-18 | DRG: 872 | Disposition: A | Discharge status: Home Health | Payer: Medicare Other | Attending: Internal Medicine | Admitting: Internal Medicine

## 2019-12-13 ENCOUNTER — Encounter: Payer: Self-pay | Admitting: Emergency Medicine

## 2019-12-13 DIAGNOSIS — I25119 Atherosclerotic heart disease of native coronary artery with unspecified angina pectoris: Secondary | ICD-10-CM | POA: Diagnosis not present

## 2019-12-13 DIAGNOSIS — Z955 Presence of coronary angioplasty implant and graft: Secondary | ICD-10-CM | POA: Diagnosis not present

## 2019-12-13 DIAGNOSIS — N189 Chronic kidney disease, unspecified: Secondary | ICD-10-CM | POA: Diagnosis present

## 2019-12-13 DIAGNOSIS — E871 Hypo-osmolality and hyponatremia: Secondary | ICD-10-CM | POA: Diagnosis present

## 2019-12-13 DIAGNOSIS — I2510 Atherosclerotic heart disease of native coronary artery without angina pectoris: Secondary | ICD-10-CM | POA: Diagnosis present

## 2019-12-13 DIAGNOSIS — J449 Chronic obstructive pulmonary disease, unspecified: Secondary | ICD-10-CM | POA: Diagnosis present

## 2019-12-13 DIAGNOSIS — A4151 Sepsis due to Escherichia coli [E. coli]: Principal | ICD-10-CM | POA: Diagnosis present

## 2019-12-13 DIAGNOSIS — B9620 Unspecified Escherichia coli [E. coli] as the cause of diseases classified elsewhere: Secondary | ICD-10-CM | POA: Diagnosis present

## 2019-12-13 DIAGNOSIS — I10 Essential (primary) hypertension: Secondary | ICD-10-CM | POA: Diagnosis not present

## 2019-12-13 DIAGNOSIS — N390 Urinary tract infection, site not specified: Secondary | ICD-10-CM | POA: Diagnosis present

## 2019-12-13 DIAGNOSIS — Z87891 Personal history of nicotine dependence: Secondary | ICD-10-CM | POA: Diagnosis not present

## 2019-12-13 DIAGNOSIS — Z7982 Long term (current) use of aspirin: Secondary | ICD-10-CM

## 2019-12-13 DIAGNOSIS — Z7951 Long term (current) use of inhaled steroids: Secondary | ICD-10-CM

## 2019-12-13 DIAGNOSIS — N179 Acute kidney failure, unspecified: Secondary | ICD-10-CM | POA: Diagnosis present

## 2019-12-13 DIAGNOSIS — A419 Sepsis, unspecified organism: Secondary | ICD-10-CM

## 2019-12-13 DIAGNOSIS — E876 Hypokalemia: Secondary | ICD-10-CM | POA: Diagnosis not present

## 2019-12-13 DIAGNOSIS — Z951 Presence of aortocoronary bypass graft: Secondary | ICD-10-CM | POA: Diagnosis not present

## 2019-12-13 DIAGNOSIS — A4150 Gram-negative sepsis, unspecified: Secondary | ICD-10-CM | POA: Diagnosis not present

## 2019-12-13 DIAGNOSIS — Z7902 Long term (current) use of antithrombotics/antiplatelets: Secondary | ICD-10-CM

## 2019-12-13 DIAGNOSIS — Z79899 Other long term (current) drug therapy: Secondary | ICD-10-CM

## 2019-12-13 DIAGNOSIS — E860 Dehydration: Secondary | ICD-10-CM | POA: Diagnosis present

## 2019-12-13 DIAGNOSIS — N400 Enlarged prostate without lower urinary tract symptoms: Secondary | ICD-10-CM | POA: Diagnosis present

## 2019-12-13 DIAGNOSIS — I129 Hypertensive chronic kidney disease with stage 1 through stage 4 chronic kidney disease, or unspecified chronic kidney disease: Secondary | ICD-10-CM | POA: Diagnosis present

## 2019-12-13 DIAGNOSIS — Z880 Allergy status to penicillin: Secondary | ICD-10-CM | POA: Diagnosis not present

## 2019-12-13 DIAGNOSIS — Z20822 Contact with and (suspected) exposure to covid-19: Secondary | ICD-10-CM | POA: Diagnosis present

## 2019-12-13 DIAGNOSIS — R6520 Severe sepsis without septic shock: Secondary | ICD-10-CM | POA: Diagnosis not present

## 2019-12-13 DIAGNOSIS — R001 Bradycardia, unspecified: Secondary | ICD-10-CM | POA: Diagnosis not present

## 2019-12-13 DIAGNOSIS — R7881 Bacteremia: Secondary | ICD-10-CM | POA: Diagnosis not present

## 2019-12-13 DIAGNOSIS — I25708 Atherosclerosis of coronary artery bypass graft(s), unspecified, with other forms of angina pectoris: Secondary | ICD-10-CM

## 2019-12-13 LAB — CBC WITH DIFFERENTIAL/PLATELET
Abs Immature Granulocytes: 0.06 10*3/uL (ref 0.00–0.07)
Basophils Absolute: 0 10*3/uL (ref 0.0–0.1)
Basophils Relative: 1 %
Eosinophils Absolute: 0 10*3/uL (ref 0.0–0.5)
Eosinophils Relative: 0 %
HCT: 37.8 % — ABNORMAL LOW (ref 39.0–52.0)
Hemoglobin: 13.2 g/dL (ref 13.0–17.0)
Immature Granulocytes: 1 %
Lymphocytes Relative: 3 %
Lymphs Abs: 0.2 10*3/uL — ABNORMAL LOW (ref 0.7–4.0)
MCH: 32.6 pg (ref 26.0–34.0)
MCHC: 34.9 g/dL (ref 30.0–36.0)
MCV: 93.3 fL (ref 80.0–100.0)
Monocytes Absolute: 0.6 10*3/uL (ref 0.1–1.0)
Monocytes Relative: 8 %
Neutro Abs: 7.1 10*3/uL (ref 1.7–7.7)
Neutrophils Relative %: 87 %
Platelets: 262 10*3/uL (ref 150–400)
RBC: 4.05 MIL/uL — ABNORMAL LOW (ref 4.22–5.81)
RDW: 13.2 % (ref 11.5–15.5)
WBC: 8.1 10*3/uL (ref 4.0–10.5)
nRBC: 0 % (ref 0.0–0.2)

## 2019-12-13 LAB — COMPREHENSIVE METABOLIC PANEL
ALT: 17 U/L (ref 0–44)
AST: 25 U/L (ref 15–41)
Albumin: 3.6 g/dL (ref 3.5–5.0)
Alkaline Phosphatase: 66 U/L (ref 38–126)
Anion gap: 13 (ref 5–15)
BUN: 32 mg/dL — ABNORMAL HIGH (ref 8–23)
CO2: 18 mmol/L — ABNORMAL LOW (ref 22–32)
Calcium: 8.2 mg/dL — ABNORMAL LOW (ref 8.9–10.3)
Chloride: 99 mmol/L (ref 98–111)
Creatinine, Ser: 2.49 mg/dL — ABNORMAL HIGH (ref 0.61–1.24)
GFR, Estimated: 25 mL/min — ABNORMAL LOW (ref >60)
Glucose, Bld: 143 mg/dL — ABNORMAL HIGH (ref 70–99)
Potassium: 3.1 mmol/L — ABNORMAL LOW (ref 3.5–5.1)
Sodium: 130 mmol/L — ABNORMAL LOW (ref 135–145)
Total Bilirubin: 1.7 mg/dL — ABNORMAL HIGH (ref 0.3–1.2)
Total Protein: 7.1 g/dL (ref 6.5–8.1)

## 2019-12-13 LAB — URINALYSIS, COMPLETE (UACMP) WITH MICROSCOPIC
Bilirubin Urine: NEGATIVE
Glucose, UA: NEGATIVE mg/dL
Ketones, ur: NEGATIVE mg/dL
Nitrite: NEGATIVE
Protein, ur: 100 mg/dL — AB
Specific Gravity, Urine: 1.012 (ref 1.005–1.030)
WBC, UA: >50 WBC/hpf — ABNORMAL HIGH (ref 0–5)
pH: 5 (ref 5.0–8.0)

## 2019-12-13 LAB — RESPIRATORY PANEL BY RT PCR (FLU A&B, COVID)
Influenza A by PCR: NEGATIVE
Influenza B by PCR: NEGATIVE
SARS Coronavirus 2 by RT PCR: NEGATIVE

## 2019-12-13 LAB — PROTIME-INR
INR: 1.1 (ref 0.8–1.2)
Prothrombin Time: 13.3 seconds (ref 11.4–15.2)

## 2019-12-13 LAB — TROPONIN I (HIGH SENSITIVITY)
Troponin I (High Sensitivity): 54 ng/L — ABNORMAL HIGH (ref <18)
Troponin I (High Sensitivity): 44 ng/L — ABNORMAL HIGH (ref <18)

## 2019-12-13 LAB — LACTIC ACID, PLASMA
Lactic Acid, Venous: 2.3 mmol/L (ref 0.5–1.9)
Lactic Acid, Venous: 3.1 mmol/L (ref 0.5–1.9)

## 2019-12-13 MED ORDER — LACTATED RINGERS IV BOLUS
1000.0000 mL | Freq: Once | INTRAVENOUS | Status: AC
  Administered 2019-12-13: 1000 mL via INTRAVENOUS

## 2019-12-13 MED ORDER — LACTATED RINGERS IV SOLN: INTRAVENOUS | Status: AC

## 2019-12-13 MED ORDER — SODIUM CHLORIDE 0.9 % IV SOLN
1.0000 g | INTRAVENOUS | Status: DC
  Administered 2019-12-13: 1 g via INTRAVENOUS
  Filled 2019-12-13: qty 10

## 2019-12-13 MED ORDER — LACTATED RINGERS IV BOLUS (SEPSIS)
1000.0000 mL | Freq: Once | INTRAVENOUS | Status: AC
  Administered 2019-12-13: 1000 mL via INTRAVENOUS

## 2019-12-13 MED ORDER — LACTATED RINGERS IV BOLUS
250.0000 mL | Freq: Once | INTRAVENOUS | Status: AC
  Administered 2019-12-13: 250 mL via INTRAVENOUS

## 2019-12-13 NOTE — ED Notes (Signed)
Pt given water at this time 

## 2019-12-13 NOTE — ED Provider Notes (Signed)
[facility] Emergency Department Provider Note   ____________________________________________   First MD Initiated Contact with Patient 12/13/19 1757     (approximate)  I have reviewed the triage vital signs and the nursing notes.   HISTORY  Chief Complaint Chills and Fatigue    HPI Jesus Stuart is a 86 y.o. male with past medical history of hypertension, CAD, COPD, and BPH who presents to the ED complaining of fever and chills.  Patient reports that he has been dealing with dysuria ever since he had a cardiac catheterization and stent placement 5 days ago.  He was experiencing some dyspnea on exertion prior to stent placement, states this now feels better.  He developed fever and chills 2 days ago and has started feeling weaker than usual.  He denies any cough, chest pain, shortness of breath, abdominal pain, vomiting, or diarrhea.  He states the catheterization site in his right groin has been healing well with no redness, warmth, or pain.        Past Medical History:  Diagnosis Date  . BPH (benign prostatic hyperplasia)   . COPD (chronic obstructive pulmonary disease) (HCC)   . Hypertension   . Reactive airway disease     Patient Active Problem List   Diagnosis Date Noted  . S/P drug eluting coronary stent placement 12/08/2019    Past Surgical History:  Procedure Laterality Date  . CORONARY ARTERY BYPASS GRAFT    . CORONARY STENT INTERVENTION N/A 12/08/2019   Procedure: CORONARY STENT INTERVENTION;  Surgeon: Alwyn Pea, MD;  Location: [facility] INVASIVE CV LAB;  Service: Cardiovascular;  Laterality: N/A;  . LEFT HEART CATH AND CORONARY ANGIOGRAPHY Left 12/08/2019   Procedure: LEFT HEART CATH AND CORONARY ANGIOGRAPHY;  Surgeon: Alwyn Pea, MD;  Location: [facility] INVASIVE CV LAB;  Service: Cardiovascular;  Laterality: Left;    Prior to Admission medications   Medication Sig Start Date End Date Taking? Authorizing Provider   albuterol (VENTOLIN HFA) 108 (90 Base) MCG/ACT inhaler Inhale 1-2 puffs into the lungs every 6 (six) hours as needed for wheezing or shortness of breath. 10/16/19   [provider]  aspirin EC 81 MG tablet Take 81 mg by mouth at bedtime. Swallow whole.    [provider]  atorvastatin (LIPITOR) 40 MG tablet Take 1 tablet (40 mg total) by mouth daily. 12/09/19   Dalia Heading, MD  budesonide-formoterol (SYMBICORT) 160-4.5 MCG/ACT inhaler Inhale 2 puffs into the lungs 2 (two) times daily as needed (respiratory issues.).    [provider]  clopidogrel (PLAVIX) 75 MG tablet Take 75 mg by mouth at bedtime. 09/27/19   [provider]  isosorbide mononitrate (IMDUR) 60 MG 24 hr tablet Take 60 mg by mouth every evening. 11/17/19   [provider]  losartan-hydrochlorothiazide (HYZAAR) 100-25 MG tablet Take 1 tablet by mouth every evening. 11/18/19   [provider]  metoprolol succinate (TOPROL-XL) 25 MG 24 hr tablet Take 25 mg by mouth every evening. 11/16/19   [provider]  nitroGLYCERIN (NITROSTAT) 0.4 MG SL tablet Place 0.4 mg under the tongue every 5 (five) minutes x 3 doses as needed for chest pain. 10/22/19   [provider]  pantoprazole (PROTONIX) 40 MG tablet Take 40 mg by mouth at bedtime. 11/03/19   [provider]    Allergies Penicillin g  History reviewed. No pertinent family history.  Social History Social History   Tobacco Use  . Smoking status: Former Games developer  .  Smokeless tobacco: Never Used  . Tobacco comment: quit 86yrs ago  Vaping Use  . Vaping Use: Never used  Substance Use Topics  . Alcohol use: Not on file  . Drug use: Never    Review of Systems  Constitutional: Positive for fever/chills Eyes: No visual changes. ENT: No sore throat. Cardiovascular: Denies chest pain. Respiratory: Denies shortness of breath. Gastrointestinal: No abdominal pain.  No nausea, no vomiting.  No diarrhea.  No  constipation. Genitourinary: Positive for dysuria. Musculoskeletal: Negative for back pain. Skin: Negative for rash. Neurological: Negative for headaches, focal weakness or numbness.  ____________________________________________   PHYSICAL EXAM:  VITAL SIGNS: ED Triage Vitals  Enc Vitals Group     BP 12/13/19 1713 (!) 99/53     Pulse Rate 12/13/19 1713 89     Resp 12/13/19 1713 (!) 26     Temp 12/13/19 1713 100.2 F (37.9 C)     Temp Source 12/13/19 1713 Oral     SpO2 12/13/19 1713 94 %     Weight 12/13/19 1714 159 lb 2.8 oz (72.2 kg)     Height 12/13/19 1714 5\' 9"  (1.753 m)     Head Circumference --      Peak Flow --      Pain Score 12/13/19 1714 0     Pain Loc --      Pain Edu? --      Excl. in GC? --     Constitutional: Alert and oriented. Eyes: Conjunctivae are normal. Head: Atraumatic. Nose: No congestion/rhinnorhea. Mouth/Throat: Mucous membranes are moist. Neck: Normal ROM Cardiovascular: Normal rate, regular rhythm. Grossly normal heart sounds.  Catheterization site to right groin clean, dry, and intact with no erythema, warmth, or tenderness. Respiratory: Normal respiratory effort.  No retractions. Lungs CTAB. Gastrointestinal: Soft and nontender. No distention. Genitourinary: deferred Musculoskeletal: No lower extremity tenderness nor edema. Neurologic:  Normal speech and language. No gross focal neurologic deficits are appreciated. Skin:  Skin is warm, dry and intact. No rash noted. Psychiatric: Mood and affect are normal. Speech and behavior are normal.  ____________________________________________   LABS (all labs ordered are listed, but only abnormal results are displayed)  Labs Reviewed  COMPREHENSIVE METABOLIC PANEL - Abnormal; Notable for the following components:      Result Value   Sodium 130 (*)    Potassium 3.1 (*)    CO2 18 (*)    Glucose, Bld 143 (*)    BUN 32 (*)    Creatinine, Ser 2.49 (*)    Calcium 8.2 (*)    Total Bilirubin 1.7  (*)    GFR, Estimated 25 (*)    All other components within normal limits  LACTIC ACID, PLASMA - Abnormal; Notable for the following components:   Lactic Acid, Venous 2.3 (*)    All other components within normal limits  CBC WITH DIFFERENTIAL/PLATELET - Abnormal; Notable for the following components:   RBC 4.05 (*)    HCT 37.8 (*)    Lymphs Abs 0.2 (*)    All other components within normal limits  URINALYSIS, COMPLETE (UACMP) WITH MICROSCOPIC - Abnormal; Notable for the following components:   Color, Urine AMBER (*)    APPearance CLOUDY (*)    Hgb urine dipstick SMALL (*)    Protein, ur 100 (*)    Leukocytes,Ua MODERATE (*)    WBC, UA >50 (*)    Bacteria, UA RARE (*)    All other components within normal limits  TROPONIN I (HIGH SENSITIVITY) - Abnormal;  Notable for the following components:   Troponin I (High Sensitivity) 54 (*)    All other components within normal limits  RESPIRATORY PANEL BY RT PCR (FLU A&B, COVID)  CULTURE, BLOOD (ROUTINE X 2)  CULTURE, BLOOD (ROUTINE X 2)  URINE CULTURE  PROTIME-INR  LACTIC ACID, PLASMA  TROPONIN I (HIGH SENSITIVITY)   ____________________________________________  EKG  ED ECG REPORT I, Chesley Noon, the attending physician, personally viewed and interpreted this ECG.   Date: 12/13/2019  EKG Time: 17:19  Rate: 89  Rhythm: normal sinus rhythm  Axis: Normal  Intervals:right bundle branch block  ST&T Change: None   PROCEDURES  Procedure(s) performed (including Critical Care):  .Critical Care Performed by: Chesley Noon, MD Authorized by: Chesley Noon, MD   Critical care provider statement:    Critical care time (minutes):  45   Critical care time was exclusive of:  Separately billable procedures and treating other patients and teaching time   Critical care was necessary to treat or prevent imminent or life-threatening deterioration of the following conditions:  Sepsis   Critical care was time spent personally by me  on the following activities:  Discussions with consultants, evaluation of patient's response to treatment, examination of patient, ordering and performing treatments and interventions, ordering and review of laboratory studies, ordering and review of radiographic studies, pulse oximetry, re-evaluation of patient's condition, obtaining history from patient or surrogate and review of old charts   I assumed direction of critical care for this patient from another provider in my specialty: no       ____________________________________________   INITIAL IMPRESSION / ASSESSMENT AND PLAN / ED COURSE       86 year old male with past medical history of hypertension, CAD, COPD, and BPH who presents to the ED complaining of dysuria for the past 5 days and chills developing 2 days ago.  He was noted to have a temperature of 102.3 with EMS, subsequently 100.2 here in the ED.  Combined with mild tachypnea and likely UTI, presentation is concerning for sepsis.  We will start antibiotics and hydrate patient with IV fluids, lactate mildly elevated and we will trend.  Chest x-ray is negative for acute process, no evidence of infiltrate or edema by my read, and patient denies any intra-abdominal symptoms.  Lab work does show significant AKI, likely related to sepsis.  Blood pressure is improving with IV fluids, patient remains awake and alert and in no respiratory distress.  UA confirms infection, which we have treated with Rocephin.  COVID-19 testing is negative.  Case discussed with hospitalist for admission.      ____________________________________________   FINAL CLINICAL IMPRESSION(S) / ED DIAGNOSES  Final diagnoses:  Sepsis with acute renal failure without septic shock, due to unspecified organism, unspecified acute renal failure type [facility])  Lower urinary tract infectious disease     ED Discharge Orders    None       Note:  This document was prepared using Dragon voice recognition software  and may include unintentional dictation errors.   Chesley Noon, MD 12/13/19 [phone]

## 2019-12-13 NOTE — Progress Notes (Signed)
CODE SEPSIS - PHARMACY COMMUNICATION  **Broad Spectrum Antibiotics should be administered within 1 hour of Sepsis diagnosis**  Time Code Sepsis Called/Page Received: 1835  Antibiotics Ordered: Ceftriaxone  Time of 1st antibiotic administration: 1843  Additional action taken by pharmacy: N/A  Tressie Ellis 12/13/2019  6:41 PM

## 2019-12-13 NOTE — ED Notes (Signed)
Date and time results received: 12/13/19    Test: lactic acid Critical Value: 2.3  Name of Provider Notified: Dr. Larinda Buttery

## 2019-12-13 NOTE — ED Triage Notes (Signed)
Pt comes into the ED via EMS from home with c/o fever ,fatigue and cold chills , had cardiac stent last week was started on lipitor. 102.9 temp, 97%RA, RR 20,

## 2019-12-13 NOTE — H&P (Signed)
History and Physical   Jesus Stuart ZOX:[id number] DOB: 08-04-1930 DOA: 12/13/2019  Referring MD/NP/PA: Dr. Larinda ButteryJessup  PCP: Lauro RegulusAnderson, Marshall W, MD   Outpatient Specialists: Dr. Harold HedgeFath, Kenneth, cardiology  Patient coming from: Home  Chief Complaint: Fatigue and chills  HPI: Jesus Stuart is a 86 y.o. male with medical history significant of COPD, hypertension, BPH, coronary artery disease, recent cardiac cath with stent placement about 5 days ago who presented with dysuria fever and chills.  Patient also had temperature up to 102.5 at home as recorded by EMS.  He was seen in the ER where he was evaluated.  He appears to be dehydrated.  No evidence of infection at the catheter site.  Patient reports of the dysuria and frequency.  This started about 2 days ago.  Urinalysis consistent with UTI.  He also meets sepsis criteria so is being admitted with sepsis secondary to UTI and patient being evaluated and treated as such..  ED Course: Temperature 100.2 blood pressure 99/53 pulse 103 respirate of 30 oxygen sat 94% room air.  White count 8.1 hemoglobin 13.1 platelets 262.  Sodium is 130 potassium 3.0 chloride 99 CO2 of 18 BUN 32 creatinine 2.49 and calcium 8.2.  Lactic acid 3.1 and glucose 143.  Urinalysis showed cloudy urine with moderate leukocytes.  WBC more than 50 g bacteria.  Troponin is 44.  Chest x-ray showed no evidence of acute cardiopulmonary abnormality.  Patient being admitted with sepsis secondary to UTI.  Review of Systems: As per HPI otherwise 10 point review of systems negative.    Past Medical History:  Diagnosis Date  . BPH (benign prostatic hyperplasia)   . COPD (chronic obstructive pulmonary disease) (HCC)   . Hypertension   . Reactive airway disease     Past Surgical History:  Procedure Laterality Date  . CORONARY ARTERY BYPASS GRAFT    . CORONARY STENT INTERVENTION N/A 12/08/2019   Procedure: CORONARY STENT INTERVENTION;  Surgeon: Alwyn Peaallwood, Dwayne D, MD;   Location: [facility] INVASIVE CV LAB;  Service: Cardiovascular;  Laterality: N/A;  . LEFT HEART CATH AND CORONARY ANGIOGRAPHY Left 12/08/2019   Procedure: LEFT HEART CATH AND CORONARY ANGIOGRAPHY;  Surgeon: Alwyn Peaallwood, Dwayne D, MD;  Location: [facility] INVASIVE CV LAB;  Service: Cardiovascular;  Laterality: Left;     reports that he has quit smoking. He has never used smokeless tobacco. He reports that he does not use drugs. No history on file for alcohol use.  Allergies  Allergen Reactions  . Penicillin G Rash    History reviewed. No pertinent family history.   Prior to Admission medications   Medication Sig Start Date End Date Taking? Authorizing Provider  albuterol (VENTOLIN HFA) 108 (90 Base) MCG/ACT inhaler Inhale 1-2 puffs into the lungs every 6 (six) hours as needed for wheezing or shortness of breath. 10/16/19  Yes [provider]  aspirin EC 81 MG tablet Take 81 mg by mouth at bedtime. Swallow whole.   Yes [provider]  atorvastatin (LIPITOR) 40 MG tablet Take 1 tablet (40 mg total) by mouth daily. 12/09/19  Yes Dalia HeadingFath, Kenneth A, MD  budesonide-formoterol (SYMBICORT) 160-4.5 MCG/ACT inhaler Inhale 2 puffs into the lungs 2 (two) times daily as needed (respiratory issues.).   Yes [provider]  clopidogrel (PLAVIX) 75 MG tablet Take 75 mg by mouth at bedtime. 09/27/19  Yes [provider]  isosorbide mononitrate (IMDUR) 60 MG 24 hr tablet Take 60 mg by mouth every evening. 11/17/19  Yes [provider]  losartan-hydrochlorothiazide (HYZAAR) 100-25 MG tablet Take 1 tablet by mouth every evening. 11/18/19  Yes [provider]  metoprolol succinate (TOPROL-XL) 25 MG 24 hr tablet Take 25 mg by mouth every evening. 11/16/19  Yes [provider]  nitroGLYCERIN (NITROSTAT) 0.4 MG SL tablet Place 0.4 mg under the tongue every 5 (five) minutes x 3 doses as needed for chest pain. 10/22/19   [provider]  pantoprazole (PROTONIX) 40 MG  tablet Take 40 mg by mouth at bedtime. Patient not taking: Reported on 12/13/2019 11/03/19   [provider]  spironolactone (ALDACTONE) 25 MG tablet Take 25 mg by mouth daily. 12/07/19   [provider]    Physical Exam: Vitals:   12/13/19 2035 12/13/19 2045 12/13/19 2045 12/13/19 2244  BP:  (!) 168/121 (!) 168/121 (!) 112/49  Pulse: (!) 103  (!) 103 81  Resp: (!) 27  (!) 28 (!) 30  Temp: 98.1 F (36.7 C)  98.1 F (36.7 C) 98.1 F (36.7 C)  TempSrc: Oral   Oral  SpO2: 100%  100% 100%  Weight:      Height:          Constitutional: Acutely ill looking, no distress Vitals:   12/13/19 2035 12/13/19 2045 12/13/19 2045 12/13/19 2244  BP:  (!) 168/121 (!) 168/121 (!) 112/49  Pulse: (!) 103  (!) 103 81  Resp: (!) 27  (!) 28 (!) 30  Temp: 98.1 F (36.7 C)  98.1 F (36.7 C) 98.1 F (36.7 C)  TempSrc: Oral   Oral  SpO2: 100%  100% 100%  Weight:      Height:       Eyes: PERRL, lids and conjunctivae normal ENMT: Mucous membranes are moist. Posterior pharynx clear of any exudate or lesions.Normal dentition.  Neck: normal, supple, no masses, no thyromegaly Respiratory: clear to auscultation bilaterally, no wheezing, no crackles. Normal respiratory effort. No accessory muscle use.  Cardiovascular: Sinus tachycardia, no murmurs / rubs / gallops. No extremity edema. 2+ pedal pulses. No carotid bruits.  Abdomen: no tenderness, no masses palpated. No hepatosplenomegaly. Bowel sounds positive.  Musculoskeletal: no clubbing / cyanosis. No joint deformity upper and lower extremities. Good ROM, no contractures. Normal muscle tone.  Skin: no rashes, lesions, ulcers. No induration Neurologic: CN 2-12 grossly intact. Sensation intact, DTR normal. Strength 5/5 in all 4.  Psychiatric: Normal judgment and insight. Alert and oriented x 3. Normal mood.     Labs on Admission: I have personally reviewed following labs and imaging studies  CBC: Recent Labs  Lab 12/13/19 1722    WBC 8.1  NEUTROABS 7.1  HGB 13.2  HCT 37.8*  MCV 93.3  PLT 262   Basic Metabolic Panel: Recent Labs  Lab 12/09/19 0855 12/13/19 1722  NA 137 130*  K 4.3 3.1*  CL 104 99  CO2 23 18*  GLUCOSE 111* 143*  BUN 18 32*  CREATININE 1.13 2.49*  CALCIUM 8.8* 8.2*   GFR: Estimated Creatinine Clearance: 21.3 mL/min (A) (by C-G formula based on SCr of 2.49 mg/dL (H)). Liver Function Tests: Recent Labs  Lab 12/13/19 1722  AST 25  ALT 17  ALKPHOS 66  BILITOT 1.7*  PROT 7.1  ALBUMIN 3.6   No results for input(s): LIPASE, AMYLASE in the last 168 hours. No results for input(s): AMMONIA in the last 168 hours. Coagulation Profile: Recent Labs  Lab 12/13/19 1722  INR 1.1   Cardiac Enzymes: No results for input(s): CKTOTAL, CKMB, CKMBINDEX, TROPONINI in the last 168  hours. BNP (last 3 results) No results for input(s): PROBNP in the last 8760 hours. HbA1C: No results for input(s): HGBA1C in the last 72 hours. CBG: No results for input(s): GLUCAP in the last 168 hours. Lipid Profile: No results for input(s): CHOL, HDL, LDLCALC, TRIG, CHOLHDL, LDLDIRECT in the last 72 hours. Thyroid Function Tests: No results for input(s): TSH, T4TOTAL, FREET4, T3FREE, THYROIDAB in the last 72 hours. Anemia Panel: No results for input(s): VITAMINB12, FOLATE, FERRITIN, TIBC, IRON, RETICCTPCT in the last 72 hours. Urine analysis:    Component Value Date/Time   COLORURINE AMBER (A) 12/13/2019 1819   APPEARANCEUR CLOUDY (A) 12/13/2019 1819   LABSPEC 1.012 12/13/2019 1819   PHURINE 5.0 12/13/2019 1819   GLUCOSEU NEGATIVE 12/13/2019 1819   HGBUR SMALL (A) 12/13/2019 1819   BILIRUBINUR NEGATIVE 12/13/2019 1819   KETONESUR NEGATIVE 12/13/2019 1819   PROTEINUR 100 (A) 12/13/2019 1819   NITRITE NEGATIVE 12/13/2019 1819   LEUKOCYTESUR MODERATE (A) 12/13/2019 1819   Sepsis Labs: (procalcitonin:4,lacticidven:4) ) Recent Results (from the past 240 hour(s))  SARS CORONAVIRUS 2 (TAT 6-24  HRS) Nasopharyngeal Nasopharyngeal Swab     Status: None   Collection Time: 12/06/19 11:30 AM   Specimen: Nasopharyngeal Swab  Result Value Ref Range Status   SARS Coronavirus 2 NEGATIVE NEGATIVE Final    Comment: (NOTE) SARS-CoV-2 target nucleic acids are NOT DETECTED.  The SARS-CoV-2 RNA is generally detectable in upper and lower respiratory specimens during the acute phase of infection. Negative results do not preclude SARS-CoV-2 infection, do not rule out co-infections with other pathogens, and should not be used as the sole basis for treatment or other patient management decisions. Negative results must be combined with clinical observations, patient history, and epidemiological information. The expected result is Negative.  Fact Sheet for Patients: HairSlick.no  Fact Sheet for Healthcare Providers: quierodirigir.com  This test is not yet approved or cleared by the [place] FDA and  has been authorized for detection and/or diagnosis of SARS-CoV-2 by FDA under an Emergency Use Authorization (EUA). This EUA will remain  in effect (meaning this test can be used) for the duration of the COVID-19 declaration under Se ction 564(b)(1) of the Act, 21 U.S.C. section 360bbb-3(b)(1), unless the authorization is terminated or revoked sooner.  Performed at [facility], [address] [place], [place] [address]   Respiratory Panel by RT PCR (Flu A&B, Covid) - Nasopharyngeal Swab     Status: None   Collection Time: 12/13/19  5:22 PM   Specimen: Nasopharyngeal Swab  Result Value Ref Range Status   SARS Coronavirus 2 by RT PCR NEGATIVE NEGATIVE Final    Comment: (NOTE) SARS-CoV-2 target nucleic acids are NOT DETECTED.  The SARS-CoV-2 RNA is generally detectable in upper respiratoy specimens during the acute phase of infection. The lowest concentration of SARS-CoV-2 viral copies this assay can detect is 131 copies/mL. A  negative result does not preclude SARS-Cov-2 infection and should not be used as the sole basis for treatment or other patient management decisions. A negative result may occur with  improper specimen collection/handling, submission of specimen other than nasopharyngeal swab, presence of viral mutation(s) within the areas targeted by this assay, and inadequate number of viral copies (<131 copies/mL). A negative result must be combined with clinical observations, patient history, and epidemiological information. The expected result is Negative.  Fact Sheet for Patients:  https://www.moore.com/  Fact Sheet for Healthcare Providers:  https://www.young.biz/  This test is no t yet approved or cleared by  the [employer] and  has been authorized for detection and/or diagnosis of SARS-CoV-2 by FDA under an Emergency Use Authorization (EUA). This EUA will remain  in effect (meaning this test can be used) for the duration of the COVID-19 declaration under Section 564(b)(1) of the Act, 21 U.S.C. section 360bbb-3(b)(1), unless the authorization is terminated or revoked sooner.     Influenza A by PCR NEGATIVE NEGATIVE Final   Influenza B by PCR NEGATIVE NEGATIVE Final    Comment: (NOTE) The Xpert Xpress SARS-CoV-2/FLU/RSV assay is intended as an aid in  the diagnosis of influenza from Nasopharyngeal swab specimens and  should not be used as a sole basis for treatment. Nasal washings and  aspirates are unacceptable for Xpert Xpress SARS-CoV-2/FLU/RSV  testing.  Fact Sheet for Patients: https://www.moore.com/  Fact Sheet for Healthcare Providers: https://www.young.biz/  This test is not yet approved or cleared by the [place] FDA and  has been authorized for detection and/or diagnosis of SARS-CoV-2 by  FDA under an Emergency Use Authorization (EUA). This EUA will remain  in effect (meaning this test can  be used) for the duration of the  Covid-19 declaration under Section 564(b)(1) of the Act, 21  U.S.C. section 360bbb-3(b)(1), unless the authorization is  terminated or revoked. Performed at [facility], [address] [place], [place] [address]      Radiological Exams on Admission: DG Chest [facility] 1 View  Result Date: 12/13/2019 CLINICAL DATA:  Sepsis. Additional history provided: Patient presents with fever, chills, fatigue, dizziness, cardiac stent placed last week. EXAM: PORTABLE CHEST 1 VIEW COMPARISON:  Report from prior chest radiographs 05/03/2002 (images unavailable). FINDINGS: Prior median sternotomy and CABG. Discontinuity of multiple sternal cerclage wires. Heart size at the upper limits of normal. Aortic atherosclerosis. No appreciable airspace consolidation or pulmonary edema. No evidence of pleural effusion or pneumothorax. No acute bony abnormality identified. IMPRESSION: No evidence of acute cardiopulmonary abnormality. Prior median sternotomy/CABG with discontinuity of multiple sternal cerclage wires. Heart size at the upper limits of normal. Aortic Atherosclerosis (ICD10-I70.0). Electronically Signed   By: Jackey Loge DO   On: 12/13/2019 18:23    EKG: Independently reviewed.  Sinus rhythm with no significant ST changes.  Assessment/Plan Principal Problem:   Sepsis due to gram-negative UTI (HCC) Active Problems:   COPD (chronic obstructive pulmonary disease) (HCC)   Essential hypertension   Hypokalemia   Hyponatremia   AKI (acute kidney injury) (HCC)     #1 sepsis due to UTI: Patient doing better already with some fluids.  Should be admitted for sepsis to monitored bed.  Blood and urine cultures obtained.  IV Rocephin with IV fluids.  Continue with treatment until cultures antibiotics so antibiotics can be adjusted.  #2 COPD: No acute exacerbation.  #3 hyponatremia: Probably dehydration.  Gentle saline.  #4 hypokalemia: Replete potassium.  #5 AKI:  Probably prerenal especially with recent cath.  Hydrate and monitor closely.  #6 coronary artery disease: Status post recent catheterization.  Continue to follow cardiology  #7 essential hypertension: Continue blood pressure control.   DVT prophylaxis: Lovenox Code Status: Full code Family Communication: No family at bedside Disposition Plan: Home Consults called: None Admission status: Inpatient  Severity of Illness: The appropriate patient status for this patient is INPATIENT. Inpatient status is judged to be reasonable and necessary in order to provide the required intensity of service to ensure the patient's safety. The patient's presenting symptoms, physical exam findings, and initial radiographic and laboratory data in the context  of their chronic comorbidities is felt to place them at high risk for further clinical deterioration. Furthermore, it is not anticipated that the patient will be medically stable for discharge from the hospital within 2 midnights of admission. The following factors support the patient status of inpatient.   " The patient's presenting symptoms include dysuria with fever and chills. " The worrisome physical exam findings include dry mucous membranes and tachycardia. " The initial radiographic and laboratory data are worrisome because of sepsis criteria with UTI. " The chronic co-morbidities include coronary artery disease.   * I certify that at the point of admission it is my clinical judgment that the patient will require inpatient hospital care spanning beyond 2 midnights from the point of admission due to high intensity of service, high risk for further deterioration and high frequency of surveillance required.Lonia Blood MD [facility] Pager [phone]  If 7PM-7AM, please contact night-coverage [email or web address] Password [facility]  12/13/2019, 11:53 PM

## 2019-12-13 NOTE — ED Notes (Signed)
Gave pt graham cracker and orange juice. Family at bed side.

## 2019-12-13 NOTE — ED Triage Notes (Signed)
Pt presents to ED via POV with c/o fever/chils/fatigue/dizziness. Per first RN Note pt had cardiac stent placed last week.   Pt with slight fever on arrival to ED. Pt states he did not know he had a fever prior to EMS arrival at his home.

## 2019-12-13 NOTE — Progress Notes (Signed)
Notified bedside nurse of need to draw repeat lactic acid. 

## 2019-12-14 ENCOUNTER — Inpatient Hospital Stay: Payer: Medicare Other

## 2019-12-14 DIAGNOSIS — N179 Acute kidney failure, unspecified: Secondary | ICD-10-CM | POA: Diagnosis not present

## 2019-12-14 DIAGNOSIS — E871 Hypo-osmolality and hyponatremia: Secondary | ICD-10-CM

## 2019-12-14 DIAGNOSIS — J449 Chronic obstructive pulmonary disease, unspecified: Secondary | ICD-10-CM

## 2019-12-14 DIAGNOSIS — I10 Essential (primary) hypertension: Secondary | ICD-10-CM | POA: Diagnosis not present

## 2019-12-14 DIAGNOSIS — A4150 Gram-negative sepsis, unspecified: Secondary | ICD-10-CM | POA: Diagnosis not present

## 2019-12-14 DIAGNOSIS — E876 Hypokalemia: Secondary | ICD-10-CM

## 2019-12-14 LAB — COMPREHENSIVE METABOLIC PANEL
ALT: 16 U/L (ref 0–44)
AST: 25 U/L (ref 15–41)
Albumin: 2.8 g/dL — ABNORMAL LOW (ref 3.5–5.0)
Alkaline Phosphatase: 53 U/L (ref 38–126)
Anion gap: 8 (ref 5–15)
BUN: 27 mg/dL — ABNORMAL HIGH (ref 8–23)
CO2: 22 mmol/L (ref 22–32)
Calcium: 7.7 mg/dL — ABNORMAL LOW (ref 8.9–10.3)
Chloride: 102 mmol/L (ref 98–111)
Creatinine, Ser: 2.04 mg/dL — ABNORMAL HIGH (ref 0.61–1.24)
GFR, Estimated: 31 mL/min — ABNORMAL LOW (ref >60)
Glucose, Bld: 144 mg/dL — ABNORMAL HIGH (ref 70–99)
Potassium: 3.1 mmol/L — ABNORMAL LOW (ref 3.5–5.1)
Sodium: 132 mmol/L — ABNORMAL LOW (ref 135–145)
Total Bilirubin: 1.2 mg/dL (ref 0.3–1.2)
Total Protein: 5.8 g/dL — ABNORMAL LOW (ref 6.5–8.1)

## 2019-12-14 LAB — BLOOD CULTURE ID PANEL (REFLEXED) - BCID2

## 2019-12-14 LAB — CBC
HCT: 32.9 % — ABNORMAL LOW (ref 39.0–52.0)
Hemoglobin: 11.5 g/dL — ABNORMAL LOW (ref 13.0–17.0)
MCH: 32.9 pg (ref 26.0–34.0)
MCHC: 35 g/dL (ref 30.0–36.0)
MCV: 94 fL (ref 80.0–100.0)
Platelets: 213 10*3/uL (ref 150–400)
RBC: 3.5 MIL/uL — ABNORMAL LOW (ref 4.22–5.81)
RDW: 13.2 % (ref 11.5–15.5)
WBC: 14.2 10*3/uL — ABNORMAL HIGH (ref 4.0–10.5)
nRBC: 0 % (ref 0.0–0.2)

## 2019-12-14 LAB — PROTIME-INR
INR: 1.1 (ref 0.8–1.2)
Prothrombin Time: 14.1 seconds (ref 11.4–15.2)

## 2019-12-14 LAB — PROCALCITONIN: Procalcitonin: 31.72 ng/mL

## 2019-12-14 LAB — CORTISOL-AM, BLOOD: Cortisol - AM: 25 ug/dL — ABNORMAL HIGH (ref 6.7–22.6)

## 2019-12-14 LAB — LACTIC ACID, PLASMA: Lactic Acid, Venous: 1.4 mmol/L (ref 0.5–1.9)

## 2019-12-14 MED ORDER — ACETAMINOPHEN 325 MG PO TABS
650.0000 mg | ORAL_TABLET | Freq: Four times a day (QID) | ORAL | Status: DC | PRN
  Administered 2019-12-16: 650 mg via ORAL
  Filled 2019-12-14: qty 2

## 2019-12-14 MED ORDER — METOPROLOL SUCCINATE ER 25 MG PO TB24
25.0000 mg | ORAL_TABLET | Freq: Every evening | ORAL | Status: DC
  Administered 2019-12-14: 25 mg via ORAL
  Filled 2019-12-14: qty 1

## 2019-12-14 MED ORDER — ONDANSETRON HCL 4 MG/2ML IJ SOLN: 4.0000 mg | Freq: Four times a day (QID) | INTRAMUSCULAR | Status: DC | PRN

## 2019-12-14 MED ORDER — LOSARTAN POTASSIUM 50 MG PO TABS: 100.0000 mg | ORAL_TABLET | Freq: Every day | ORAL | Status: DC

## 2019-12-14 MED ORDER — ONDANSETRON HCL 4 MG PO TABS: 4.0000 mg | ORAL_TABLET | Freq: Four times a day (QID) | ORAL | Status: DC | PRN

## 2019-12-14 MED ORDER — MOMETASONE FURO-FORMOTEROL FUM 200-5 MCG/ACT IN AERO
2.0000 | INHALATION_SPRAY | Freq: Two times a day (BID) | RESPIRATORY_TRACT | Status: DC
  Administered 2019-12-14 – 2019-12-18 (×8): 2 via RESPIRATORY_TRACT
  Filled 2019-12-14: qty 8.8

## 2019-12-14 MED ORDER — ALBUTEROL SULFATE HFA 108 (90 BASE) MCG/ACT IN AERS
1.0000 | INHALATION_SPRAY | Freq: Four times a day (QID) | RESPIRATORY_TRACT | Status: DC | PRN
  Filled 2019-12-14: qty 6.7

## 2019-12-14 MED ORDER — NITROGLYCERIN 0.4 MG SL SUBL: 0.4000 mg | SUBLINGUAL_TABLET | SUBLINGUAL | Status: DC | PRN

## 2019-12-14 MED ORDER — TAMSULOSIN HCL 0.4 MG PO CAPS
0.4000 mg | ORAL_CAPSULE | Freq: Every day | ORAL | Status: DC
  Administered 2019-12-14 – 2019-12-17 (×4): 0.4 mg via ORAL
  Filled 2019-12-14 (×4): qty 1

## 2019-12-14 MED ORDER — CLOPIDOGREL BISULFATE 75 MG PO TABS
75.0000 mg | ORAL_TABLET | Freq: Every day | ORAL | Status: DC
  Administered 2019-12-14 – 2019-12-17 (×5): 75 mg via ORAL
  Filled 2019-12-14 (×5): qty 1

## 2019-12-14 MED ORDER — SODIUM CHLORIDE 0.9% FLUSH
3.0000 mL | Freq: Two times a day (BID) | INTRAVENOUS | Status: DC
  Administered 2019-12-14 – 2019-12-17 (×5): 3 mL via INTRAVENOUS

## 2019-12-14 MED ORDER — SODIUM CHLORIDE 0.9 % IV SOLN
2.0000 g | INTRAVENOUS | Status: DC
  Administered 2019-12-14 – 2019-12-15 (×2): 2 g via INTRAVENOUS
  Filled 2019-12-14: qty 2
  Filled 2019-12-14 (×2): qty 20

## 2019-12-14 MED ORDER — POTASSIUM CHLORIDE CRYS ER 20 MEQ PO TBCR
40.0000 meq | EXTENDED_RELEASE_TABLET | Freq: Once | ORAL | Status: AC
  Administered 2019-12-14: 40 meq via ORAL
  Filled 2019-12-14: qty 2

## 2019-12-14 MED ORDER — LOSARTAN POTASSIUM-HCTZ 100-25 MG PO TABS: 1.0000 | ORAL_TABLET | Freq: Every evening | ORAL | Status: DC

## 2019-12-14 MED ORDER — ENOXAPARIN SODIUM 30 MG/0.3ML ~~LOC~~ SOLN
30.0000 mg | SUBCUTANEOUS | Status: DC
  Administered 2019-12-14: 30 mg via SUBCUTANEOUS
  Filled 2019-12-14: qty 0.3

## 2019-12-14 MED ORDER — PANTOPRAZOLE SODIUM 40 MG PO TBEC
40.0000 mg | DELAYED_RELEASE_TABLET | Freq: Every day | ORAL | Status: DC
  Administered 2019-12-14 – 2019-12-17 (×5): 40 mg via ORAL
  Filled 2019-12-14 (×5): qty 1

## 2019-12-14 MED ORDER — HYDROCHLOROTHIAZIDE 25 MG PO TABS: 25.0000 mg | ORAL_TABLET | Freq: Every day | ORAL | Status: DC

## 2019-12-14 MED ORDER — ASPIRIN EC 81 MG PO TBEC
81.0000 mg | DELAYED_RELEASE_TABLET | Freq: Every day | ORAL | Status: DC
  Administered 2019-12-14 – 2019-12-17 (×5): 81 mg via ORAL
  Filled 2019-12-14 (×5): qty 1

## 2019-12-14 MED ORDER — MELATONIN 5 MG PO TABS
5.0000 mg | ORAL_TABLET | Freq: Every day | ORAL | Status: DC
  Administered 2019-12-14 – 2019-12-17 (×4): 5 mg via ORAL
  Filled 2019-12-14 (×4): qty 1

## 2019-12-14 MED ORDER — SODIUM CHLORIDE 0.9 % IV SOLN: 1.0000 g | INTRAVENOUS | Status: DC

## 2019-12-14 MED ORDER — SPIRONOLACTONE 25 MG PO TABS
25.0000 mg | ORAL_TABLET | Freq: Every day | ORAL | Status: DC
  Filled 2019-12-14 (×2): qty 1

## 2019-12-14 MED ORDER — ATORVASTATIN CALCIUM 20 MG PO TABS
40.0000 mg | ORAL_TABLET | Freq: Every day | ORAL | Status: DC
  Administered 2019-12-14 – 2019-12-18 (×5): 40 mg via ORAL
  Filled 2019-12-14 (×5): qty 2

## 2019-12-14 MED ORDER — ISOSORBIDE MONONITRATE ER 30 MG PO TB24
60.0000 mg | ORAL_TABLET | Freq: Every evening | ORAL | Status: DC
  Administered 2019-12-14: 60 mg via ORAL
  Filled 2019-12-14: qty 2

## 2019-12-14 MED ORDER — ACETAMINOPHEN 650 MG RE SUPP: 650.0000 mg | Freq: Four times a day (QID) | RECTAL | Status: DC | PRN

## 2019-12-14 NOTE — Progress Notes (Signed)
PHARMACY - PHYSICIAN COMMUNICATION CRITICAL VALUE ALERT - BLOOD CULTURE IDENTIFICATION (BCID)  QUANTARIUS GENRICH is an 86 y.o. male who presented to [facility] on 12/13/2019  Assessment:  1/4 E.coli, no resistance detected  Name of physician (or Provider) Contacted: Manuela Schwartz, NP  Current antibiotics: Rocephin 1 g IV q24h  Changes to prescribed antibiotics recommended: increase to 2 g IV q24h  Results for orders placed or performed during the hospital encounter of 12/13/19  Blood Culture ID Panel (Reflexed) (Collected: 12/13/2019  5:22 PM)  Result Value Ref Range   Enterococcus faecalis NOT DETECTED NOT DETECTED   Enterococcus Faecium NOT DETECTED NOT DETECTED   Listeria monocytogenes NOT DETECTED NOT DETECTED   Staphylococcus species NOT DETECTED NOT DETECTED   Staphylococcus aureus (BCID) NOT DETECTED NOT DETECTED   Staphylococcus epidermidis NOT DETECTED NOT DETECTED   Staphylococcus lugdunensis NOT DETECTED NOT DETECTED   Streptococcus species NOT DETECTED NOT DETECTED   Streptococcus agalactiae NOT DETECTED NOT DETECTED   Streptococcus pneumoniae NOT DETECTED NOT DETECTED   Streptococcus pyogenes NOT DETECTED NOT DETECTED   A.calcoaceticus-baumannii NOT DETECTED NOT DETECTED   Bacteroides fragilis NOT DETECTED NOT DETECTED   Enterobacterales DETECTED (A) NOT DETECTED   Enterobacter cloacae complex NOT DETECTED NOT DETECTED   Escherichia coli DETECTED (A) NOT DETECTED   Klebsiella aerogenes NOT DETECTED NOT DETECTED   Klebsiella oxytoca NOT DETECTED NOT DETECTED   Klebsiella pneumoniae NOT DETECTED NOT DETECTED   Proteus species NOT DETECTED NOT DETECTED   Salmonella species NOT DETECTED NOT DETECTED   Serratia marcescens NOT DETECTED NOT DETECTED   Haemophilus influenzae NOT DETECTED NOT DETECTED   Neisseria meningitidis NOT DETECTED NOT DETECTED   Pseudomonas aeruginosa NOT DETECTED NOT DETECTED   Stenotrophomonas maltophilia NOT DETECTED NOT DETECTED   Candida  albicans NOT DETECTED NOT DETECTED   Candida auris NOT DETECTED NOT DETECTED   Candida glabrata NOT DETECTED NOT DETECTED   Candida krusei NOT DETECTED NOT DETECTED   Candida parapsilosis NOT DETECTED NOT DETECTED   Candida tropicalis NOT DETECTED NOT DETECTED   Cryptococcus neoformans/gattii NOT DETECTED NOT DETECTED   CTX-M ESBL NOT DETECTED NOT DETECTED   Carbapenem resistance IMP NOT DETECTED NOT DETECTED   Carbapenem resistance KPC NOT DETECTED NOT DETECTED   Carbapenem resistance NDM NOT DETECTED NOT DETECTED   Carbapenem resist OXA 48 LIKE NOT DETECTED NOT DETECTED   Carbapenem resistance VIM NOT DETECTED NOT DETECTED    Pricilla Riffle, PharmD 12/14/2019  5:22 AM

## 2019-12-14 NOTE — Progress Notes (Addendum)
PROGRESS NOTE  Jesus Stuart:[id number] DOB: Apr 03, 1930 DOA: 12/13/2019 PCP: Kirk Ruths, MD   LOS: 1 day   Brief narrative: As per HPI,  Jesus Stuart is a 86 y.o. male with medical history significant of COPD, hypertension, BPH, coronary artery disease, recent cardiac cath with stent placement about 5 days ago presented to hospital with complaints of dysuria, fever and chills with a temperature of around 102.5 F noted by EMS.  Patient met sepsis criteria so was admitted to hospital.  In the ED since initial temperature was 100.2 F with mild tachycardia and respiratory rate of 30/min.  Sodium was130 potassium 3.0 chloride 99 CO2 of 18 BUN 32 creatinine 2.49 and calcium 8.2.  Lactic acid 3.1 and glucose 143.  Urinalysis showed cloudy urine with moderate leukocytes.  Urine analysis was abnormal. Troponin was 44.  Chest x-ray showed no evidence of acute cardiopulmonary abnormality.  Patient was then admitted to hospital with sepsis secondary to UTI.  Assessment/Plan:  Principal Problem:   Sepsis due to gram-negative UTI (HCC) Active Problems:   COPD (chronic obstructive pulmonary disease) (HCC)   Essential hypertension   Hypokalemia   Hyponatremia   AKI (acute kidney injury) ([place])  Severe sepsis due to E. coli UTI:  Patient presented with signs of sepsis including leukocytosis, fever, tachypnea, elevated lactate, acute kidney injury. blood cultures showing E. coli..  On IV Rocephin.  Still very dysuric and burning micturation.  Continue to follow  urine cultures.  WBC elevated at 14.2.  Initially lactate was elevated at 3.1.  Recent lactate of 1.4.  This improved with IV fluids.  Received IV fluids.  Initial procalcitonin was elevated at 31.7.  COVID-19 and flu was negative.patient received septic bolus in the ED. Decrease IV fluid rate to 100 ml/h.  T-max of 100.2 F.  Encourage oral hydration.  We will add renal ultrasound.  We will check for bladder and prostate as  well.  Will check post void residual volume.  Unable to give Pyridium due to renal failure. Added flomax empirically.  COPD: Not in  acute exacerbation.  On Dulera.  We will continue with that.  Add as needed albuterol inhaler  Hyponatremia:  Mild.  Will closely monitor on fluid replacement.  Check BMP in a.m.  Mild hypokalemia.  We will continue to replenish.  Check levels in a.m.  Potassium 3.1 today.  We will give a 40 potassium x1 today.   AKI:  Creatinine on presentation at 2.4.  Creatinine 2.04 today.  Patient did have recent cardiac catheterization 5 days back.  Acute kidney injury likely to be secondary  to sepsis given the timeframe.  We will continue to monitor.  Monitor intake and output charting.  Continue on IV fluids with Ringer lactate.  Will decrease to 100 ml/h.  Discontinue losartan and HCTZ.  History of coronary artery disease: Status post recent catheterization 5 days back status post coronary stent..    No active chest pain at this time.  Continue to monitor closely.  Continue aspirin, Lipitor Plavix Imdur and metoprolol.  Essential hypertension: Continue metoprolol, hold losartan HCTZ and spironolactone for now.  Closely monitor blood pressure.  Add as needed antihypertensive.  DVT prophylaxis: enoxaparin (LOVENOX) injection 30 mg Start: 12/14/19 0100 SCDs Start: 12/14/19 0012   Code Status: Full code  Family Communication: I spoke with the patient's daughter on the phone and updated her about the clinical condition of the patient.   Status is: Inpatient  Remains inpatient appropriate  because:IV treatments appropriate due to intensity of illness or inability to take PO and Inpatient level of care appropriate due to severity of illness   Dispo: The patient is from: Home              Anticipated d/c is to: Home              Anticipated d/c date is: 3 days              Patient currently is not medically stable to  d/c.   Consultants:  None  Procedures:  None  Antibiotics:  . Rocephin IV 10/25>  Anti-infectives (From admission, onward)   Start     Dose/Rate Route Frequency Ordered Stop   12/14/19 1800  cefTRIAXone (ROCEPHIN) 2 g in sodium chloride 0.9 % 100 mL IVPB        2 g 200 mL/hr over 30 Minutes Intravenous Every 24 hours 12/14/19 0520     12/14/19 0015  cefTRIAXone (ROCEPHIN) 1 g in sodium chloride 0.9 % 100 mL IVPB  Status:  Discontinued        1 g 200 mL/hr over 30 Minutes Intravenous Every 24 hours 12/14/19 0011 12/14/19 0521   12/13/19 1830  cefTRIAXone (ROCEPHIN) 1 g in sodium chloride 0.9 % 100 mL IVPB  Status:  Discontinued        1 g 200 mL/hr over 30 Minutes Intravenous Every 24 hours 12/13/19 1829 12/14/19 0520     Subjective: Today, patient was seen and examined at bedside. Feels dysuric especially on urination. No abdominal pain, fever or chills. No nausea vomiting or shortness of breath.    Objective: Vitals:   12/14/19 0530 12/14/19 0633  BP: 132/66 (!) 145/56  Pulse: (!) 56 64  Resp: 18 19  Temp:  99.4 F (37.4 C)  SpO2: 97% 98%    Intake/Output Summary (Last 24 hours) at 12/14/2019 0758 Last data filed at 12/14/2019 0650 Gross per 24 hour  Intake 4029.16 ml  Output 150 ml  Net 3879.16 ml   Filed Weights   12/13/19 1714  Weight: 72.2 kg   Body mass index is 23.51 kg/m.   Physical Exam: GENERAL: Patient is alert awake and oriented. Not in obvious distress.  HENT: No scleral pallor or icterus. Pupils equally reactive to light. Oral mucosa is moist NECK: is supple, no gross swelling noted. CHEST: Clear to auscultation. No crackles or wheezes.  Diminished breath sounds bilaterally. CVS: S1 and S2 heard, no murmur. Regular rate and rhythm.  ABDOMEN: Soft, non-tender, bowel sounds are present. EXTREMITIES: No edema. CNS: Cranial nerves are intact. No focal motor deficits. SKIN: warm and dry without rashes.  Data Review: I have personally  reviewed the following laboratory data and studies,  CBC: Recent Labs  Lab 12/13/19 1722 12/14/19 0404  WBC 8.1 14.2*  NEUTROABS 7.1  --   HGB 13.2 11.5*  HCT 37.8* 32.9*  MCV 93.3 94.0  PLT 262 678   Basic Metabolic Panel: Recent Labs  Lab 12/09/19 0855 12/13/19 1722 12/14/19 0404  NA 137 130* 132*  K 4.3 3.1* 3.1*  CL 104 99 102  CO2 23 18* 22  GLUCOSE 111* 143* 144*  BUN 18 32* 27*  CREATININE 1.13 2.49* 2.04*  CALCIUM 8.8* 8.2* 7.7*   Liver Function Tests: Recent Labs  Lab 12/13/19 1722 12/14/19 0404  AST 25 25  ALT 17 16  ALKPHOS 66 53  BILITOT 1.7* 1.2  PROT 7.1 5.8*  ALBUMIN 3.6 2.8*  No results for input(s): LIPASE, AMYLASE in the last 168 hours. No results for input(s): AMMONIA in the last 168 hours. Cardiac Enzymes: No results for input(s): CKTOTAL, CKMB, CKMBINDEX, TROPONINI in the last 168 hours. BNP (last 3 results) No results for input(s): BNP in the last 8760 hours.  ProBNP (last 3 results) No results for input(s): PROBNP in the last 8760 hours.  CBG: No results for input(s): GLUCAP in the last 168 hours. Recent Results (from the past 240 hour(s))  SARS CORONAVIRUS 2 (TAT 6-24 HRS) Nasopharyngeal Nasopharyngeal Swab     Status: None   Collection Time: 12/06/19 11:30 AM   Specimen: Nasopharyngeal Swab  Result Value Ref Range Status   SARS Coronavirus 2 NEGATIVE NEGATIVE Final    Comment: (NOTE) SARS-CoV-2 target nucleic acids are NOT DETECTED.  The SARS-CoV-2 RNA is generally detectable in upper and lower respiratory specimens during the acute phase of infection. Negative results do not preclude SARS-CoV-2 infection, do not rule out co-infections with other pathogens, and should not be used as the sole basis for treatment or other patient management decisions. Negative results must be combined with clinical observations, patient history, and epidemiological information. The expected result is Negative.  Fact Sheet for  Patients: SugarRoll.be  Fact Sheet for Healthcare Providers: https://www.woods-mathews.com/  This test is not yet approved or cleared by the [place] FDA and  has been authorized for detection and/or diagnosis of SARS-CoV-2 by FDA under an Emergency Use Authorization (EUA). This EUA will remain  in effect (meaning this test can be used) for the duration of the COVID-19 declaration under Se ction 564(b)(1) of the Act, 21 U.S.C. section 360bbb-3(b)(1), unless the authorization is terminated or revoked sooner.  Performed at [facility], [place] [address] [place], [place] [address]   Culture, blood (Routine x 2)     Status: None (Preliminary result)   Collection Time: 12/13/19  5:22 PM   Specimen: BLOOD  Result Value Ref Range Status   Specimen Description BLOOD BLOOD RIGHT FOREARM  Final   Special Requests   Final    BOTTLES DRAWN AEROBIC AND ANAEROBIC Blood Culture adequate volume   Culture  Setup Time   Final    Organism ID to follow GRAM NEGATIVE RODS IN BOTH AEROBIC AND ANAEROBIC BOTTLES CRITICAL RESULT CALLED TO, READ BACK BY AND VERIFIED WITHDorena Bodo @ 0451 12/14/19 RH Performed at [facility], [address] [place], [place] [address]    Culture GRAM NEGATIVE RODS  Final   Report Status PENDING  Incomplete  Respiratory Panel by RT PCR (Flu A&B, Covid) - Nasopharyngeal Swab     Status: None   Collection Time: 12/13/19  5:22 PM   Specimen: Nasopharyngeal Swab  Result Value Ref Range Status   SARS Coronavirus 2 by RT PCR NEGATIVE NEGATIVE Final    Comment: (NOTE) SARS-CoV-2 target nucleic acids are NOT DETECTED.  The SARS-CoV-2 RNA is generally detectable in upper respiratoy specimens during the acute phase of infection. The lowest concentration of SARS-CoV-2 viral copies this assay can detect is 131 copies/mL. A negative result does not preclude SARS-Cov-2 infection and should not be used as the sole  basis for treatment or other patient management decisions. A negative result may occur with  improper specimen collection/handling, submission of specimen other than nasopharyngeal swab, presence of viral mutation(s) within the areas targeted by this assay, and inadequate number of viral copies (<131 copies/mL). A negative result must be combined with clinical observations, patient history,  and epidemiological information. The expected result is Negative.  Fact Sheet for Patients:  PinkCheek.be  Fact Sheet for Healthcare Providers:  GravelBags.it  This test is no t yet approved or cleared by the [place] FDA and  has been authorized for detection and/or diagnosis of SARS-CoV-2 by FDA under an Emergency Use Authorization (EUA). This EUA will remain  in effect (meaning this test can be used) for the duration of the COVID-19 declaration under Section 564(b)(1) of the Act, 21 U.S.C. section 360bbb-3(b)(1), unless the authorization is terminated or revoked sooner.     Influenza A by PCR NEGATIVE NEGATIVE Final   Influenza B by PCR NEGATIVE NEGATIVE Final    Comment: (NOTE) The Xpert Xpress SARS-CoV-2/FLU/RSV assay is intended as an aid in  the diagnosis of influenza from Nasopharyngeal swab specimens and  should not be used as a sole basis for treatment. Nasal washings and  aspirates are unacceptable for Xpert Xpress SARS-CoV-2/FLU/RSV  testing.  Fact Sheet for Patients: PinkCheek.be  Fact Sheet for Healthcare Providers: GravelBags.it  This test is not yet approved or cleared by the [place] FDA and  has been authorized for detection and/or diagnosis of SARS-CoV-2 by  FDA under an Emergency Use Authorization (EUA). This EUA will remain  in effect (meaning this test can be used) for the duration of the  Covid-19 declaration under Section 564(b)(1) of the  Act, 21  U.S.C. section 360bbb-3(b)(1), unless the authorization is  terminated or revoked. Performed at [facility], [address] [place], [place] [address]   Blood Culture ID Panel (Reflexed)     Status: Abnormal   Collection Time: 12/13/19  5:22 PM  Result Value Ref Range Status   Enterococcus faecalis NOT DETECTED NOT DETECTED Final   Enterococcus Faecium NOT DETECTED NOT DETECTED Final   Listeria monocytogenes NOT DETECTED NOT DETECTED Final   Staphylococcus species NOT DETECTED NOT DETECTED Final   Staphylococcus aureus (BCID) NOT DETECTED NOT DETECTED Final   Staphylococcus epidermidis NOT DETECTED NOT DETECTED Final   Staphylococcus lugdunensis NOT DETECTED NOT DETECTED Final   Streptococcus species NOT DETECTED NOT DETECTED Final   Streptococcus agalactiae NOT DETECTED NOT DETECTED Final   Streptococcus pneumoniae NOT DETECTED NOT DETECTED Final   Streptococcus pyogenes NOT DETECTED NOT DETECTED Final   A.calcoaceticus-baumannii NOT DETECTED NOT DETECTED Final   Bacteroides fragilis NOT DETECTED NOT DETECTED Final   Enterobacterales DETECTED (A) NOT DETECTED Final    Comment: Enterobacterales represent a large order of gram negative bacteria, not a single organism. CRITICAL RESULT CALLED TO, READ BACK BY AND VERIFIED WITH: ABBY ELLINGTON @ 0451 12/14/19 RH    Enterobacter cloacae complex NOT DETECTED NOT DETECTED Final   Escherichia coli DETECTED (A) NOT DETECTED Final    Comment: CRITICAL RESULT CALLED TO, READ BACK BY AND VERIFIED WITH: ABBY ELLINGTON @ 0451 12/14/19 RH    Klebsiella aerogenes NOT DETECTED NOT DETECTED Final   Klebsiella oxytoca NOT DETECTED NOT DETECTED Final   Klebsiella pneumoniae NOT DETECTED NOT DETECTED Final   Proteus species NOT DETECTED NOT DETECTED Final   Salmonella species NOT DETECTED NOT DETECTED Final   Serratia marcescens NOT DETECTED NOT DETECTED Final   Haemophilus influenzae NOT DETECTED NOT DETECTED Final   Neisseria  meningitidis NOT DETECTED NOT DETECTED Final   Pseudomonas aeruginosa NOT DETECTED NOT DETECTED Final   Stenotrophomonas maltophilia NOT DETECTED NOT DETECTED Final   Candida albicans NOT DETECTED NOT DETECTED Final   Candida auris NOT DETECTED NOT DETECTED Final  Candida glabrata NOT DETECTED NOT DETECTED Final   Candida krusei NOT DETECTED NOT DETECTED Final   Candida parapsilosis NOT DETECTED NOT DETECTED Final   Candida tropicalis NOT DETECTED NOT DETECTED Final   Cryptococcus neoformans/gattii NOT DETECTED NOT DETECTED Final   CTX-M ESBL NOT DETECTED NOT DETECTED Final   Carbapenem resistance IMP NOT DETECTED NOT DETECTED Final   Carbapenem resistance KPC NOT DETECTED NOT DETECTED Final   Carbapenem resistance NDM NOT DETECTED NOT DETECTED Final   Carbapenem resist OXA 48 LIKE NOT DETECTED NOT DETECTED Final   Carbapenem resistance VIM NOT DETECTED NOT DETECTED Final    Comment: Performed at [facility], [address] [place], [place] [address]  Culture, blood (Routine x 2)     Status: None (Preliminary result)   Collection Time: 12/13/19  5:28 PM   Specimen: BLOOD  Result Value Ref Range Status   Specimen Description BLOOD BLOOD RIGHT FOREARM  Final   Special Requests   Final    BOTTLES DRAWN AEROBIC AND ANAEROBIC Blood Culture adequate volume   Culture  Setup Time   Final    GRAM NEGATIVE RODS AEROBIC BOTTLE ONLY CRITICAL VALUE NOTED.  VALUE IS CONSISTENT WITH PREVIOUSLY REPORTED AND CALLED VALUE. Performed at [facility], [address], [place], [place] [address]    Culture GRAM NEGATIVE RODS  Final   Report Status PENDING  Incomplete     Studies: DG Chest Port 1 View  Result Date: 12/13/2019 CLINICAL DATA:  Sepsis. Additional history provided: Patient presents with fever, chills, fatigue, dizziness, cardiac stent placed last week. EXAM: PORTABLE CHEST 1 VIEW COMPARISON:  Report from prior chest radiographs 05/03/2002 (images unavailable).  FINDINGS: Prior median sternotomy and CABG. Discontinuity of multiple sternal cerclage wires. Heart size at the upper limits of normal. Aortic atherosclerosis. No appreciable airspace consolidation or pulmonary edema. No evidence of pleural effusion or pneumothorax. No acute bony abnormality identified. IMPRESSION: No evidence of acute cardiopulmonary abnormality. Prior median sternotomy/CABG with discontinuity of multiple sternal cerclage wires. Heart size at the upper limits of normal. Aortic Atherosclerosis (ICD10-I70.0). Electronically Signed   By: Kellie Simmering DO   On: 12/13/2019 18:23      Flora Lipps, MD  [facility] 12/14/2019

## 2019-12-15 DIAGNOSIS — N390 Urinary tract infection, site not specified: Secondary | ICD-10-CM | POA: Diagnosis not present

## 2019-12-15 DIAGNOSIS — A4150 Gram-negative sepsis, unspecified: Secondary | ICD-10-CM | POA: Diagnosis not present

## 2019-12-15 LAB — BASIC METABOLIC PANEL
Anion gap: 9 (ref 5–15)
BUN: 26 mg/dL — ABNORMAL HIGH (ref 8–23)
CO2: 24 mmol/L (ref 22–32)
Calcium: 8.1 mg/dL — ABNORMAL LOW (ref 8.9–10.3)
Chloride: 102 mmol/L (ref 98–111)
Creatinine, Ser: 1.6 mg/dL — ABNORMAL HIGH (ref 0.61–1.24)
GFR, Estimated: 42 mL/min — ABNORMAL LOW (ref >60)
Glucose, Bld: 165 mg/dL — ABNORMAL HIGH (ref 70–99)
Potassium: 3.9 mmol/L (ref 3.5–5.1)
Sodium: 135 mmol/L (ref 135–145)

## 2019-12-15 LAB — CBC
HCT: 32.8 % — ABNORMAL LOW (ref 39.0–52.0)
Hemoglobin: 11.1 g/dL — ABNORMAL LOW (ref 13.0–17.0)
MCH: 32.6 pg (ref 26.0–34.0)
MCHC: 33.8 g/dL (ref 30.0–36.0)
MCV: 96.5 fL (ref 80.0–100.0)
Platelets: 207 10*3/uL (ref 150–400)
RBC: 3.4 MIL/uL — ABNORMAL LOW (ref 4.22–5.81)
RDW: 13.5 % (ref 11.5–15.5)
WBC: 8.4 10*3/uL (ref 4.0–10.5)
nRBC: 0 % (ref 0.0–0.2)

## 2019-12-15 MED ORDER — METOPROLOL SUCCINATE ER 25 MG PO TB24
12.5000 mg | ORAL_TABLET | Freq: Every evening | ORAL | Status: DC
  Filled 2019-12-15: qty 1

## 2019-12-15 MED ORDER — ENOXAPARIN SODIUM 40 MG/0.4ML ~~LOC~~ SOLN: 40.0000 mg | SUBCUTANEOUS | Status: DC

## 2019-12-15 MED ORDER — ENOXAPARIN SODIUM 40 MG/0.4ML ~~LOC~~ SOLN
40.0000 mg | SUBCUTANEOUS | Status: DC
  Administered 2019-12-15 – 2019-12-17 (×3): 40 mg via SUBCUTANEOUS
  Filled 2019-12-15 (×3): qty 0.4

## 2019-12-15 MED ORDER — SODIUM CHLORIDE 0.45 % IV SOLN: INTRAVENOUS | Status: DC

## 2019-12-15 MED ORDER — ISOSORBIDE MONONITRATE ER 30 MG PO TB24
30.0000 mg | ORAL_TABLET | Freq: Every evening | ORAL | Status: DC
  Administered 2019-12-17: 30 mg via ORAL
  Filled 2019-12-15 (×2): qty 1

## 2019-12-15 NOTE — Progress Notes (Addendum)
PROGRESS NOTE    Jesus Stuart  ZGY:[id number] DOB: 03-12-1930 DOA: 12/13/2019 PCP: Jesus Ruths, MD    Brief Narrative:  Jesus Stuart a 86 y.o.malewith medical history significant ofCOPD, hypertension, BPH, coronary artery disease, recent cardiac cath with stent placement about 5 days ago presented to hospital with complaints of dysuria, fever and chills with a temperature of around 102.5 F noted by EMS.  Patient met sepsis criteria so was admitted to hospital.  In the ED since initial temperature was 100.2 F with mild tachycardia and respiratory rate of 30/min.  10/27-no overnight issues. Remains afebrile.   Consultants:     Procedures:   Antimicrobials:   Rocephin    Subjective: Patient reports feeling much better today.  No nausea, abdominal pain, vomiting  Objective: Vitals:   12/14/19 2038 12/14/19 2351 12/15/19 0543 12/15/19 0757  BP: (!) 107/58 (!) 115/48 (!) 136/53 (!) 122/47  Pulse: (!) 55 (!) 55 (!) 45 95  Resp: _0 Temp: 98.5 F (36.9 C) 97.9 F (36.6 C) 97.7 F (36.5 C) 98.3 F (36.8 C)  TempSrc: Oral Oral Oral Oral  SpO2: 99% 97% 97% 98%  Weight:      Height:        Intake/Output Summary (Last 24 hours) at 12/15/2019 0951 Last data filed at 12/15/2019 0545 Gross per 24 hour  Intake 1662.6 ml  Output 2190 ml  Net -527.4 ml   Filed Weights   12/13/19 1714  Weight: 72.2 kg    Examination:  General exam: Appears calm and comfortable  Respiratory system: Clear to auscultation. Respiratory effort normal. Cardiovascular system: S1 & S2 heard, RRR. No JVD, murmurs, rubs, gallops or clicks. No pedal edema. Gastrointestinal system: Abdomen is nondistended, soft and nontender.. Normal bowel sounds heard. Central nervous system: Alert and oriented x3.  Grossly intact Extremities: No edema Skin: Warm dry Psychiatry:  Mood & affect appropriate in current setting.     Data Reviewed: I have personally reviewed  following labs and imaging studies  CBC: Recent Labs  Lab 12/13/19 1722 12/14/19 0404  WBC 8.1 14.2*  NEUTROABS 7.1  --   HGB 13.2 11.5*  HCT 37.8* 32.9*  MCV 93.3 94.0  PLT 262 591   Basic Metabolic Panel: Recent Labs  Lab 12/09/19 0855 12/13/19 1722 12/14/19 0404  NA 137 130* 132*  K 4.3 3.1* 3.1*  CL 104 99 102  CO2 23 18* 22  GLUCOSE 111* 143* 144*  BUN 18 32* 27*  CREATININE 1.13 2.49* 2.04*  CALCIUM 8.8* 8.2* 7.7*   GFR: Estimated Creatinine Clearance: 26 mL/min (A) (by C-G formula based on SCr of 2.04 mg/dL (H)). Liver Function Tests: Recent Labs  Lab 12/13/19 1722 12/14/19 0404  AST 25 25  ALT 17 16  ALKPHOS 66 53  BILITOT 1.7* 1.2  PROT 7.1 5.8*  ALBUMIN 3.6 2.8*   No results for input(s): LIPASE, AMYLASE in the last 168 hours. No results for input(s): AMMONIA in the last 168 hours. Coagulation Profile: Recent Labs  Lab 12/13/19 1722 12/14/19 0404  INR 1.1 1.1   Cardiac Enzymes: No results for input(s): CKTOTAL, CKMB, CKMBINDEX, TROPONINI in the last 168 hours. BNP (last 3 results) No results for input(s): PROBNP in the last 8760 hours. HbA1C: No results for input(s): HGBA1C in the last 72 hours. CBG: No results for input(s): GLUCAP in the last 168 hours. Lipid Profile: No results for input(s): CHOL, HDL, LDLCALC, TRIG, CHOLHDL, LDLDIRECT in the last 72  hours. Thyroid Function Tests: No results for input(s): TSH, T4TOTAL, FREET4, T3FREE, THYROIDAB in the last 72 hours. Anemia Panel: No results for input(s): VITAMINB12, FOLATE, FERRITIN, TIBC, IRON, RETICCTPCT in the last 72 hours. Sepsis Labs: Recent Labs  Lab 12/13/19 1722 12/13/19 2235 12/14/19 0305 12/14/19 0404  PROCALCITON  --   --   --  31.72  LATICACIDVEN 2.3* 3.1* 1.4  --     Recent Results (from the past 240 hour(s))  SARS CORONAVIRUS 2 (TAT 6-24 HRS) Nasopharyngeal Nasopharyngeal Swab     Status: None   Collection Time: 12/06/19 11:30 AM   Specimen: Nasopharyngeal Swab   Result Value Ref Range Status   SARS Coronavirus 2 NEGATIVE NEGATIVE Final    Comment: (NOTE) SARS-CoV-2 target nucleic acids are NOT DETECTED.  The SARS-CoV-2 RNA is generally detectable in upper and lower respiratory specimens during the acute phase of infection. Negative results do not preclude SARS-CoV-2 infection, do not rule out co-infections with other pathogens, and should not be used as the sole basis for treatment or other patient management decisions. Negative results must be combined with clinical observations, patient history, and epidemiological information. The expected result is Negative.  Fact Sheet for Patients: SugarRoll.be  Fact Sheet for Healthcare Providers: https://www.woods-mathews.com/  This test is not yet approved or cleared by the [place] FDA and  has been authorized for detection and/or diagnosis of SARS-CoV-2 by FDA under an Emergency Use Authorization (EUA). This EUA will remain  in effect (meaning this test can be used) for the duration of the COVID-19 declaration under Se ction 564(b)(1) of the Act, 21 U.S.C. section 360bbb-3(b)(1), unless the authorization is terminated or revoked sooner.  Performed at [facility], [place] [address] [place], [place] [address]   Culture, blood (Routine x 2)     Status: Abnormal (Preliminary result)   Collection Time: 12/13/19  5:22 PM   Specimen: BLOOD  Result Value Ref Range Status   Specimen Description   Final    BLOOD BLOOD RIGHT FOREARM Performed at [facility], [address], [place], [place] [address]    Special Requests   Final    BOTTLES DRAWN AEROBIC AND ANAEROBIC Blood Culture adequate volume Performed at [facility], [address], [place], [place] [address]    Culture  Setup Time   Final    GRAM NEGATIVE RODS IN BOTH AEROBIC AND ANAEROBIC BOTTLES CRITICAL RESULT CALLED TO, READ BACK BY AND VERIFIED WITH: ABBY  ELLINGTON @ 0451 12/14/19 RH    Culture (A)  Final    ESCHERICHIA COLI SUSCEPTIBILITIES TO FOLLOW Performed at [facility], [place] [address], [place], [place] [address]    Report Status PENDING  Incomplete  Respiratory Panel by RT PCR (Flu A&B, Covid) - Nasopharyngeal Swab     Status: None   Collection Time: 12/13/19  5:22 PM   Specimen: Nasopharyngeal Swab  Result Value Ref Range Status   SARS Coronavirus 2 by RT PCR NEGATIVE NEGATIVE Final    Comment: (NOTE) SARS-CoV-2 target nucleic acids are NOT DETECTED.  The SARS-CoV-2 RNA is generally detectable in upper respiratoy specimens during the acute phase of infection. The lowest concentration of SARS-CoV-2 viral copies this assay can detect is 131 copies/mL. A negative result does not preclude SARS-Cov-2 infection and should not be used as the sole basis for treatment or other patient management decisions. A negative result may occur with  improper specimen collection/handling, submission of specimen other than nasopharyngeal swab, presence  of viral mutation(s) within the areas targeted by this assay, and inadequate number of viral copies (<131 copies/mL). A negative result must be combined with clinical observations, patient history, and epidemiological information. The expected result is Negative.  Fact Sheet for Patients:  PinkCheek.be  Fact Sheet for Healthcare Providers:  GravelBags.it  This test is no t yet approved or cleared by the [place] FDA and  has been authorized for detection and/or diagnosis of SARS-CoV-2 by FDA under an Emergency Use Authorization (EUA). This EUA will remain  in effect (meaning this test can be used) for the duration of the COVID-19 declaration under Section 564(b)(1) of the Act, 21 U.S.C. section 360bbb-3(b)(1), unless the authorization is terminated or revoked sooner.     Influenza A by PCR NEGATIVE NEGATIVE Final    Influenza B by PCR NEGATIVE NEGATIVE Final    Comment: (NOTE) The Xpert Xpress SARS-CoV-2/FLU/RSV assay is intended as an aid in  the diagnosis of influenza from Nasopharyngeal swab specimens and  should not be used as a sole basis for treatment. Nasal washings and  aspirates are unacceptable for Xpert Xpress SARS-CoV-2/FLU/RSV  testing.  Fact Sheet for Patients: PinkCheek.be  Fact Sheet for Healthcare Providers: GravelBags.it  This test is not yet approved or cleared by the [place] FDA and  has been authorized for detection and/or diagnosis of SARS-CoV-2 by  FDA under an Emergency Use Authorization (EUA). This EUA will remain  in effect (meaning this test can be used) for the duration of the  Covid-19 declaration under Section 564(b)(1) of the Act, 21  U.S.C. section 360bbb-3(b)(1), unless the authorization is  terminated or revoked. Performed at [facility], [address] [place], [place] [address]   Blood Culture ID Panel (Reflexed)     Status: Abnormal   Collection Time: 12/13/19  5:22 PM  Result Value Ref Range Status   Enterococcus faecalis NOT DETECTED NOT DETECTED Final   Enterococcus Faecium NOT DETECTED NOT DETECTED Final   Listeria monocytogenes NOT DETECTED NOT DETECTED Final   Staphylococcus species NOT DETECTED NOT DETECTED Final   Staphylococcus aureus (BCID) NOT DETECTED NOT DETECTED Final   Staphylococcus epidermidis NOT DETECTED NOT DETECTED Final   Staphylococcus lugdunensis NOT DETECTED NOT DETECTED Final   Streptococcus species NOT DETECTED NOT DETECTED Final   Streptococcus agalactiae NOT DETECTED NOT DETECTED Final   Streptococcus pneumoniae NOT DETECTED NOT DETECTED Final   Streptococcus pyogenes NOT DETECTED NOT DETECTED Final   A.calcoaceticus-baumannii NOT DETECTED NOT DETECTED Final   Bacteroides fragilis NOT DETECTED NOT DETECTED Final   Enterobacterales DETECTED (A) NOT  DETECTED Final    Comment: Enterobacterales represent a large order of gram negative bacteria, not a single organism. CRITICAL RESULT CALLED TO, READ BACK BY AND VERIFIED WITH: ABBY ELLINGTON @ 0451 12/14/19 RH    Enterobacter cloacae complex NOT DETECTED NOT DETECTED Final   Escherichia coli DETECTED (A) NOT DETECTED Final    Comment: CRITICAL RESULT CALLED TO, READ BACK BY AND VERIFIED WITH: ABBY ELLINGTON @ 0451 12/14/19 RH    Klebsiella aerogenes NOT DETECTED NOT DETECTED Final   Klebsiella oxytoca NOT DETECTED NOT DETECTED Final   Klebsiella pneumoniae NOT DETECTED NOT DETECTED Final   Proteus species NOT DETECTED NOT DETECTED Final   Salmonella species NOT DETECTED NOT DETECTED Final   Serratia marcescens NOT DETECTED NOT DETECTED Final   Haemophilus influenzae NOT DETECTED NOT DETECTED Final   Neisseria meningitidis NOT DETECTED NOT DETECTED Final   Pseudomonas aeruginosa NOT DETECTED NOT  DETECTED Final   Stenotrophomonas maltophilia NOT DETECTED NOT DETECTED Final   Candida albicans NOT DETECTED NOT DETECTED Final   Candida auris NOT DETECTED NOT DETECTED Final   Candida glabrata NOT DETECTED NOT DETECTED Final   Candida krusei NOT DETECTED NOT DETECTED Final   Candida parapsilosis NOT DETECTED NOT DETECTED Final   Candida tropicalis NOT DETECTED NOT DETECTED Final   Cryptococcus neoformans/gattii NOT DETECTED NOT DETECTED Final   CTX-M ESBL NOT DETECTED NOT DETECTED Final   Carbapenem resistance IMP NOT DETECTED NOT DETECTED Final   Carbapenem resistance KPC NOT DETECTED NOT DETECTED Final   Carbapenem resistance NDM NOT DETECTED NOT DETECTED Final   Carbapenem resist OXA 48 LIKE NOT DETECTED NOT DETECTED Final   Carbapenem resistance VIM NOT DETECTED NOT DETECTED Final    Comment: Performed at [facility], [address] [place], [place] [address]  Culture, blood (Routine x 2)     Status: Abnormal (Preliminary result)   Collection Time: 12/13/19  5:28 PM    Specimen: BLOOD  Result Value Ref Range Status   Specimen Description   Final    BLOOD BLOOD RIGHT FOREARM Performed at [facility], [address] [place], [place] [address]    Special Requests   Final    BOTTLES DRAWN AEROBIC AND ANAEROBIC Blood Culture adequate volume Performed at [facility], [address] [place], [place] [address]    Culture  Setup Time   Final    GRAM NEGATIVE RODS AEROBIC BOTTLE ONLY CRITICAL VALUE NOTED.  VALUE IS CONSISTENT WITH PREVIOUSLY REPORTED AND CALLED VALUE. Performed at [facility], [address] [place], [place] [address]    Culture ESCHERICHIA COLI (A)  Final   Report Status PENDING  Incomplete  Urine culture     Status: Abnormal (Preliminary result)   Collection Time: 12/13/19  6:19 PM   Specimen: Urine, Random  Result Value Ref Range Status   Specimen Description   Final    URINE, RANDOM Performed at [facility], [address] [place], [place] [address]    Special Requests   Final    NONE Performed at [facility], [address], [place], [place] [address]    Culture (A)  Final    >=100,000 COLONIES/mL ESCHERICHIA COLI CULTURE REINCUBATED FOR BETTER GROWTH SUSCEPTIBILITIES TO FOLLOW Performed at [facility], [place] [address] [place], [place] [address]    Report Status PENDING  Incomplete         Radiology Studies: US RENAL  Result Date: 12/14/2019 CLINICAL DATA:  Sepsis of a urinary source EXAM: RENAL / URINARY TRACT ULTRASOUND COMPLETE COMPARISON:  None. FINDINGS: Right Kidney: Renal measurements: 11.4 x 6.1 x 5.3 cm = volume: 194 mL. Renal cortical echogenicity within normal limits. Anechoic partially exophytic simple appearing cyst measuring 3.0 x 2.5 x 2.7 cm arising from the lower pole right kidney. No obstructive urolithiasis or hydronephrosis. No worrisome renal lesion. Left Kidney: Renal measurements: 11.9 x 5.4 x 4.6 cm = volume: 154 mL. Renal cortical  echogenicity within normal limits. Anechoic, partially exophytic simple appearing cyst measuring 4.1 x 2.9 x 4.1 cm arising from the upper pole left kidney. No obstructive urolithiasis or hydronephrosis. No worrisome renal lesion. Bladder: Partially decompressed at the time of examination with a prevoid volume of 102.08 and a postvoid residual of 95 cc. Right bladder jet is identified. The left bladder jet is not visualized though is nonspecific in the absence of other features of  obstruction. Slight bladder trabeculation is noted. Mild wall thickening though possibly secondary to underdistention. Other: Incidental note made of prostatomegaly with the prostate measuring approximately 5 x 4 x 5.7 cm for an estimated volume of 59.73 mL. IMPRESSION: 1. Simple appearing bilateral renal cysts. 2. No obstructive urolithiasis or hydronephrosis. 3. Prostatomegaly. 4. Bladder trabeculation and mild wall thickening may be related to underdistention and/or chronic outlet obstruction though could correlate with sepsis of urinary source. Postvoid residual of 95 CC. Electronically Signed   By: Lovena Le M.D.   On: 12/14/2019 15:53   DG Chest Port 1 View  Result Date: 12/13/2019 CLINICAL DATA:  Sepsis. Additional history provided: Patient presents with fever, chills, fatigue, dizziness, cardiac stent placed last week. EXAM: PORTABLE CHEST 1 VIEW COMPARISON:  Report from prior chest radiographs 05/03/2002 (images unavailable). FINDINGS: Prior median sternotomy and CABG. Discontinuity of multiple sternal cerclage wires. Heart size at the upper limits of normal. Aortic atherosclerosis. No appreciable airspace consolidation or pulmonary edema. No evidence of pleural effusion or pneumothorax. No acute bony abnormality identified. IMPRESSION: No evidence of acute cardiopulmonary abnormality. Prior median sternotomy/CABG with discontinuity of multiple sternal cerclage wires. Heart size at the upper limits of normal. Aortic  Atherosclerosis (ICD10-I70.0). Electronically Signed   By: Kellie Simmering DO   On: 12/13/2019 18:23        Scheduled Meds: . aspirin EC  81 mg Oral QHS  . atorvastatin  40 mg Oral Daily  . clopidogrel  75 mg Oral QHS  . enoxaparin (LOVENOX) injection  30 mg Subcutaneous Q24H  . isosorbide mononitrate  60 mg Oral QPM  . melatonin  5 mg Oral QHS  . metoprolol succinate  25 mg Oral QPM  . mometasone-formoterol  2 puff Inhalation BID  . pantoprazole  40 mg Oral QHS  . sodium chloride flush  3 mL Intravenous Q12H  . tamsulosin  0.4 mg Oral QPC supper   Continuous Infusions: . cefTRIAXone (ROCEPHIN)  IV 2 g (12/14/19 1656)    Assessment & Plan:   Principal Problem:   Sepsis due to gram-negative UTI ([place]) Active Problems:   COPD (chronic obstructive pulmonary disease) ([facility])   Essential hypertension   Hypokalemia   Hyponatremia   AKI (acute kidney injury) ([place])   E.Coli Urosepsis- with bacteremia- Patient presented with signs of sepsis including leukocytosis, fever, tachypnea, elevated lactate, acute kidney injury. blood cultures showing E. coli..   Afebrile leukocytosis improved Continue IV Rocephin until sensitivity returns, likely will need 10-14 days of treatment Renal ultrasound without any hydronephrosis see full report Empirically started on Flomax Resume ivf for hydration as also has AKI    COPD:Not in  acute exacerbation. Continue Dulera and inhalers   Hyponatremia: Mild.  Secondary to dehydration/from urosepsis Improved with IV fluids, sodium 135 today   Mild hypokalemia.  Was replaced and now stable K is 3.9 today    AKI: Creatinine on presentation at 2.4.  Patient did have recent cardiac cath 5 days back This likely is prerenal secondary to dehydration/sepsis Improved with IV fluids.  Creatinine is 1.60 today We will resume IV fluids for gentle hydration Losartan and HCTZ on hold   History of coronary artery disease:Status post recent  catheterization 5 days back status post coronary stent..  Asymptomatic currently Continue with aspirin, Lipitor, Plavix, Imdur and metoprolol Have decrease metoprolol to 12.5 as he is bradycardic   Essential hypertension:  Currently stable  Continue with metoprolol, decrease dose to 12.5 as noted above   DVT  prophylaxis: Lovenox Code Status: Full Family Communication: daughter updated via phone  Status is: Inpatient  Remains inpatient appropriate because:IV treatments appropriate due to intensity of illness or inability to take PO   Dispo: The patient is from: Home              Anticipated d/c is to: Home              Anticipated d/c date is: 2 days              Patient currently is not medically stable to d/c.            LOS: 2 days   Time spent: 35 minutes with more than 50% on [place], MD [facility] Pager [phone]  If 7PM-7AM, please contact night-coverage [email or web address] Password [email or web address] 12/15/2019, 9:51 AM

## 2019-12-15 NOTE — Progress Notes (Signed)
PHARMACIST - PHYSICIAN COMMUNICATION  CONCERNING:  Enoxaparin (Lovenox) for DVT Prophylaxis    RECOMMENDATION: Patient was prescribed enoxaprin 40mg  q24 hours for VTE prophylaxis.   Filed Weights   12/13/19 1714  Weight: 72.2 kg (159 lb 2.8 oz)    Body mass index is 23.51 kg/m.  Estimated Creatinine Clearance: 33.1 mL/min (A) (by C-G formula based on SCr of 1.6 mg/dL (H)).  Patient is candidate for enoxaparin 40 mg every 24 hours based on CrCl >53ml/min or Weight >45kg  DESCRIPTION: Pharmacy has adjusted enoxaparin dose per [facility] policy.  Patient is now receiving enoxaparin 40 mg every 24 hours    [facility], PharmD Clinical Pharmacist  12/15/2019 3:12 PM

## 2019-12-15 NOTE — Evaluation (Signed)
Physical Therapy Evaluation Patient Details Name: Jesus Stuart MRN: [id number] DOB: Jun 23, 1930 Today's Date: 12/15/2019   History of Present Illness  Jesus Stuart is a 86 y.o. male with medical history significant of COPD, hypertension, BPH, coronary artery disease, recent cardiac cath with stent placement about 5 days ago who presented with dysuria fever and chills. MD assessment includes sepsis due to UTI, hyponatremia, and hypokalemia.  Clinical Impression  Pt was pleasant and motivated to participate during the session. Pt was able to perform bed mobility IND. Pt was able to sit EOB with good balance and vitals WFL. Pt was able to perform STS with SBA. Pt used RW and Min guard to ambulate 3' feet. Pt's vitals were [facility] and pt felt okay to keep ambulating. Pt was able to ambulate 160' with no AD, no LOB, and Min guard. Pt reported feeling much weaker than his normal self and noted that he was tiring much quicker than normal. Pt will benefit from HHPT services upon discharge to safely address deficits listed in patient problem list for decreased caregiver assistance and eventual return to PLOF.      Follow Up Recommendations Home health PT    Equipment Recommendations  None recommended by PT    Recommendations for Other Services       Precautions / Restrictions Restrictions Weight Bearing Restrictions: No      Mobility  Bed Mobility Overal bed mobility: Independent                  Transfers Overall transfer level: Needs assistance   Transfers: Sit to/from Stand Sit to Stand: Supervision         General transfer comment: pt able to perform with no PT assist and displays good balance  Ambulation/Gait Ambulation/Gait assistance: Min guard Gait Distance (Feet): 160 Feet x1 no AD, 3 feet x1 Assistive device: None Gait Pattern/deviations: Narrow base of support;Decreased stride length;WFL(Within Functional Limits)     General Gait Details: pt  demonstrates good gait mechanics with no LOB. pt reports feeling weaker than his normal self but feels okay during ambulation  Stairs            Wheelchair Mobility    Modified Rankin (Stroke Patients Only)       Balance Overall balance assessment: Needs assistance Sitting-balance support: Feet supported;No upper extremity supported Sitting balance-Leahy Scale: Normal       Standing balance-Leahy Scale: Good Standing balance comment: pt has increased postural sway with static standing but is able to maintain static and dynamic standing without use of bilateral UE                             Pertinent Vitals/Pain Pain Assessment: No/denies pain    Home Living Family/patient expects to be discharged to:: Private residence Living Arrangements: Spouse/significant other Available Help at Discharge: Family;Available PRN/intermittently (wife available 24/7 but not able to provide care that requires a lot of effort) Type of Home: House Home Access: Level entry     Home Layout: One level Home Equipment: Walker - 2 wheels;Cane - single point      Prior Function Level of Independence: Independent         Comments: pt reports walking 30 minutes 3 to 4 times per week. Able to do yard work     Higher education careers adviser        Extremity/Trunk Assessment  Communication   Communication: No difficulties  Cognition Arousal/Alertness: Awake/alert Behavior During Therapy: WFL for tasks assessed/performed Overall Cognitive Status: Within Functional Limits for tasks assessed                                        General Comments General comments (skin integrity, edema, etc.): pt reports feeling significantly weaker than his normal self throughout session. Of note, pt's HR remained in the 50s throughout the session which the pt reported as his baseline    Exercises Other Exercises Other Exercises: pt educated on POC    Assessment/Plan    PT Assessment Patient needs continued PT services  PT Problem List Decreased strength;Decreased mobility;Decreased activity tolerance       PT Treatment Interventions DME instruction;Therapeutic exercise;Gait training;Balance training;Stair training;Functional mobility training;Therapeutic activities;Patient/family education    PT Goals (Current goals can be found in the Care Plan section)  Acute Rehab PT Goals Patient Stated Goal: to get back to normal PT Goal Formulation: With patient Time For Goal Achievement: 12/28/19 Potential to Achieve Goals: Good    Frequency Min 2X/week   Barriers to discharge        Co-evaluation               AM-PAC PT "6 Clicks" Mobility  Outcome Measure Help needed turning from your back to your side while in a flat bed without using bedrails?: None Help needed moving from lying on your back to sitting on the side of a flat bed without using bedrails?: None Help needed moving to and from a bed to a chair (including a wheelchair)?: None Help needed standing up from a chair using your arms (e.g., wheelchair or bedside chair)?: A Little Help needed to walk in hospital room?: A Little Help needed climbing 3-5 steps with a railing? : A Little 6 Click Score: 21    End of Session Equipment Utilized During Treatment: Gait belt Activity Tolerance: Patient tolerated treatment well Patient left: in bed;with call bell/phone within reach (pt has been walking to bathroom on his own) Nurse Communication: Mobility status PT Visit Diagnosis: Muscle weakness (generalized) (M62.81)    Time: 8502-7741 PT Time Calculation (min) (ACUTE ONLY): 24 min   Charges:              Nicolette Bang, SPT 12/15/19. 4:07 PM

## 2019-12-15 NOTE — Evaluation (Signed)
Occupational Therapy Evaluation Patient Details Name: Jesus Stuart MRN: [id number] DOB: July 21, 1930 Today's Date: 12/15/2019    History of Present Illness Jesus Stuart is a 86 y.o. male with medical history significant of COPD, hypertension, BPH, coronary artery disease, recent cardiac cath with stent placement about 5 days ago who presented with dysuria fever and chills. MD assessment includes sepsis due to UTI, hyponatremia, and hypokalemia.   Clinical Impression   Jesus Stuart was seen for OT evaluation this date. Prior to hospital admission, pt was independent in all aspects of ADL/IADL. He reports staying active, driving, shopping, and doing yard work. Pt denies falls history in past 12 months. Pt lives with his wife in a 1 level house with a level entry. Currently pt reporting symptoms have generally resolved. Pt demonstrates baseline independence to perform ADL and mobility tasks and no strength, sensory, coordination, cognitive, or visual deficits appreciated with assessment. No skilled OT needs identified. Will sign off. Please re-consult if additional OT needs arise during this admission.     Follow Up Recommendations  No OT follow up    Equipment Recommendations  None recommended by OT    Recommendations for Other Services       Precautions / Restrictions Precautions Precautions: Fall Restrictions Weight Bearing Restrictions: No      Mobility Bed Mobility Overal bed mobility: Independent             General bed mobility comments: sup<>sit from flat bed independently. good safety awareness and control.    Transfers Overall transfer level: Independent   Transfers: Sit to/from Stand Sit to Stand: Supervision         General transfer comment: Pt performs STS from bed in lowest position with no physical assist needed, now AE.    Balance Overall balance assessment: No apparent balance deficits (not formally assessed) Sitting-balance support: Feet  supported;No upper extremity supported Sitting balance-Leahy Scale: Normal       Standing balance-Leahy Scale: Good Standing balance comment: pt has increased postural sway with static standing but is able to maintain static and dynamic standing without use of bilateral UE                           ADL either performed or assessed with clinical judgement   ADL Overall ADL's : At baseline                                       General ADL Comments: Pt presents at or near baseline for functional ADL management. Has been getting up ad lib in room to use his room bathroom. He denies and focal strength or sensory deficits at time of OT evaluation. Feels generally back to baseline level of functional performance.     Vision Baseline Vision/History: Wears glasses Wears Glasses: Reading only Patient Visual Report: No change from baseline       Perception     Praxis      Pertinent Vitals/Pain Pain Assessment: No/denies pain     Hand Dominance Right   Extremity/Trunk Assessment Upper Extremity Assessment Upper Extremity Assessment: Overall WFL for tasks assessed   Lower Extremity Assessment Lower Extremity Assessment: Overall WFL for tasks assessed   Cervical / Trunk Assessment Cervical / Trunk Assessment: Normal   Communication Communication Communication: No difficulties   Cognition Arousal/Alertness: Awake/alert Behavior During Therapy: WFL for tasks assessed/performed  Overall Cognitive Status: Within Functional Limits for tasks assessed                                     General Comments  pt reports feeling significantly weaker than his normal self throughout session. Of note, pt's HR remained in the 50s throughout the session which the pt reported as his baseline    Exercises Other Exercises Other Exercises: pt educated on POC Other Exercises: Pt educated on role of OT in acute setting as well as falls prevention  strategies for home and hospital.   Shoulder Instructions      Home Living Family/patient expects to be discharged to:: Private residence Living Arrangements: Spouse/significant other Available Help at Discharge: Family;Available PRN/intermittently Type of Home: House Home Access: Level entry     Home Layout: One level     Bathroom Shower/Tub: Chief Strategy Officer: Standard     Home Equipment: Environmental consultant - 2 wheels;Cane - single point          Prior Functioning/Environment Level of Independence: Independent        Comments: Pt states he is active and independent with ADL/IADL management. +driving, grocery shopping, doing yardwork.        OT Problem List: Decreased strength;Decreased safety awareness;Decreased knowledge of use of DME or AE;Impaired balance (sitting and/or standing)      OT Treatment/Interventions:      OT Goals(Current goals can be found in the care plan section) Acute Rehab OT Goals Patient Stated Goal: To get back to my normal routines. OT Goal Formulation: All assessment and education complete, DC therapy Time For Goal Achievement: 12/15/19 Potential to Achieve Goals: Good  OT Frequency:     Barriers to D/C:            Co-evaluation              AM-PAC OT "6 Clicks" Daily Activity     Outcome Measure Help from another person eating meals?: None Help from another person taking care of personal grooming?: None Help from another person toileting, which includes using toliet, bedpan, or urinal?: None Help from another person bathing (including washing, rinsing, drying)?: None Help from another person to put on and taking off regular upper body clothing?: None Help from another person to put on and taking off regular lower body clothing?: None 6 Click Score: 24   End of Session Nurse Communication: Other (comment);Mobility status (Pt with no bed alarm, RN OK'd. OT to sign off.)  Activity Tolerance: Patient tolerated  treatment well Patient left: in bed;with call bell/phone within reach (Pt recieved with bed alarm off, spoke with RN who cleared pt to have bed alarm off at this time.)  OT Visit Diagnosis: Other abnormalities of gait and mobility (R26.89)                Time: 2993-7169 OT Time Calculation (min): 12 min Charges:  OT General Charges $OT Visit: 1 Visit OT Evaluation $OT Eval Low Complexity: 1 Low  Rockney Ghee, M.S., OTR/L Ascom: [phone] 12/15/19, 4:33 PM

## 2019-12-16 DIAGNOSIS — N390 Urinary tract infection, site not specified: Secondary | ICD-10-CM | POA: Diagnosis not present

## 2019-12-16 DIAGNOSIS — A4150 Gram-negative sepsis, unspecified: Secondary | ICD-10-CM | POA: Diagnosis not present

## 2019-12-16 LAB — CULTURE, BLOOD (ROUTINE X 2)
Special Requests: ADEQUATE
Special Requests: ADEQUATE

## 2019-12-16 LAB — BASIC METABOLIC PANEL
Anion gap: 8 (ref 5–15)
BUN: 17 mg/dL (ref 8–23)
CO2: 22 mmol/L (ref 22–32)
Calcium: 7.6 mg/dL — ABNORMAL LOW (ref 8.9–10.3)
Chloride: 103 mmol/L (ref 98–111)
Creatinine, Ser: 1.11 mg/dL (ref 0.61–1.24)
GFR, Estimated: >60 mL/min (ref >60)
Glucose, Bld: 122 mg/dL — ABNORMAL HIGH (ref 70–99)
Potassium: 3.5 mmol/L (ref 3.5–5.1)
Sodium: 133 mmol/L — ABNORMAL LOW (ref 135–145)

## 2019-12-16 LAB — CBC
HCT: 30.9 % — ABNORMAL LOW (ref 39.0–52.0)
Hemoglobin: 10.6 g/dL — ABNORMAL LOW (ref 13.0–17.0)
MCH: 32 pg (ref 26.0–34.0)
MCHC: 34.3 g/dL (ref 30.0–36.0)
MCV: 93.4 fL (ref 80.0–100.0)
Platelets: 193 10*3/uL (ref 150–400)
RBC: 3.31 MIL/uL — ABNORMAL LOW (ref 4.22–5.81)
RDW: 13.2 % (ref 11.5–15.5)
WBC: 5.4 10*3/uL (ref 4.0–10.5)
nRBC: 0 % (ref 0.0–0.2)

## 2019-12-16 LAB — PROCALCITONIN: Procalcitonin: 6.94 ng/mL

## 2019-12-16 MED ORDER — SODIUM CHLORIDE 0.9 % IV SOLN
1.0000 g | Freq: Two times a day (BID) | INTRAVENOUS | Status: DC
  Administered 2019-12-16 – 2019-12-17 (×3): 1 g via INTRAVENOUS
  Filled 2019-12-16 (×4): qty 1

## 2019-12-16 NOTE — TOC Initial Note (Signed)
Transition of Care [facility]) - Initial/Assessment Note    Patient Details  Name: Jesus Stuart MRN: [id number] Date of Birth: 02/18/1931  [facility] CM/SW Contact:    Chapman Fitch, RN Phone Number: 12/16/2019, 10:47 AM  Clinical Narrative:                  Patient admitted from home with sepsis UTI Patient states he lives at home with wife  PCP Dareen Piano - patient states that he still drives [facility] - denies issues obtaining medications  PT and OT recommend home health.  Patient in agreement.  States he does not have a preference of home health agency.  Referral made and accepted by [facility] with [facility]. Patient has Environmental consultant - 2 wheels;Cane - single point  Expected Discharge Plan: Home w Home Health Services Barriers to Discharge: Continued Medical Work up   Patient Goals and CMS Choice        Expected Discharge Plan and Services Expected Discharge Plan: Home w Home Health Services       Living arrangements for the past 2 months: Single Family Home                           HH Arranged: RN, PT, OT [facility] Agency: [facility] Date HH Agency Contacted: 12/16/19   Representative spoke with at [facility] Agency: Barbara Cower  Prior Living Arrangements/Services Living arrangements for the past 2 months: Single Family Home Lives with:: Spouse Patient language and need for interpreter reviewed:: Yes Do you feel safe going back to the place where you live?: Yes      Need for Family Participation in Patient Care: Yes (Comment) Care giver support system in place?: Yes (comment) Current home services: DME Criminal Activity/Legal Involvement Pertinent to Current Situation/Hospitalization: No - Comment as needed  Activities of Daily Living Home Assistive Devices/Equipment: None ADL Screening (condition at time of admission) Patient's cognitive ability adequate to safely complete daily activities?: Yes Is the patient deaf  or have difficulty hearing?: No Does the patient have difficulty seeing, even when wearing glasses/contacts?: No Does the patient have difficulty concentrating, remembering, or making decisions?: No Patient able to express need for assistance with ADLs?: Yes Does the patient have difficulty dressing or bathing?: No Independently performs ADLs?: Yes (appropriate for developmental age) Does the patient have difficulty walking or climbing stairs?: No Weakness of Legs: None Weakness of Arms/Hands: None  Permission Sought/Granted                  Emotional Assessment Appearance:: Appears stated age     Orientation: : Oriented to Self, Oriented to Place, Oriented to  Time, Oriented to Situation   Psych Involvement: No (comment)  Admission diagnosis:  Lower urinary tract infectious disease [N39.0] Sepsis (HCC) [A41.9] Coronary artery disease of bypass graft of native heart with stable angina pectoris (HCC) [I25.708] Sepsis due to gram-negative UTI (HCC) [A41.50, N39.0] Sepsis with acute renal failure without septic shock, due to unspecified organism, unspecified acute renal failure type (HCC) [A41.9, R65.20, N17.9] Patient Active Problem List   Diagnosis Date Noted  . COPD (chronic obstructive pulmonary disease) (HCC) 12/13/2019  . Essential hypertension 12/13/2019  . Sepsis due to gram-negative UTI (HCC) 12/13/2019  . Hypokalemia 12/13/2019  . Hyponatremia 12/13/2019  . AKI (acute kidney injury) (HCC) 12/13/2019  . S/P drug eluting coronary stent placement 12/08/2019   PCP:  Lauro Regulus,  MD Pharmacy:   [facility] [address] [place], [place] - [address] [place] [address] Phone: [phone] Fax: [phone]     Social Determinants of Health (SDOH) Interventions    Readmission Risk Interventions No flowsheet data found.

## 2019-12-16 NOTE — Progress Notes (Addendum)
PHARMACY NOTE:  ANTIMICROBIAL RENAL DOSAGE ADJUSTMENT  Current antimicrobial regimen includes a mismatch between antimicrobial dosage and estimated renal function.  As per policy approved by the Pharmacy & Therapeutics and Medical Executive Committees, the antimicrobial dosage will be adjusted accordingly.  Current antimicrobial dosage:  Merrem 1g Q8h  Indication: bacteremia  Renal Function:  Estimated Creatinine Clearance: 47.8 mL/min (by C-G formula based on SCr of 1.11 mg/dL).    Antimicrobial dosage has been changed to:  Merrem 1g q12H  Additional comments: Patient has mild reaction to Penicillin G (rash), but has tolerated 3rd gen ceph (Ceftriaxone) on this admission. Cross-reactivity to Merrem is even more remote.   Thank you for allowing pharmacy to be a part of this patient's care.  Martyn Malay, [facility] 12/16/2019 9:18 AM

## 2019-12-16 NOTE — Progress Notes (Signed)
Mobility Specialist - Progress Note   12/16/19 1200  Mobility  Activity Ambulated in hall  Level of Assistance Standby assist, set-up cues, supervision of patient - no hands on  Assistive Device None  Distance Ambulated (ft) 340 ft  Mobility Response Tolerated well  Mobility performed by Mobility specialist  $Mobility charge 1 Mobility    Pre-mobility: 96% SpO2 During mobility: 104 HR, 98% SpO2 Post-mobility: 51 HR, 98% SpO2   Pt was lying in bed upon arrival utilizing room air. Pt agreed to session. Pt denied any pain, nausea, or fatigue this date stating "he feels a lot better today." Pt was able to get EOB independently and stood without AD or hand-held assist. Pt ambulated 340' in room and hallway with no LOB noted. No heavy breathing noted, pt also denied SOB. After ~140', pt c/o feeling "a little weak" in his LE, however pt was motivated to continue with ambulation. No rest breaks taken this date. Max HR this session was 104 while ambulating, however HR significantly decreased to 51 bpm almost immediately after activity. Overall, pt tolerated very well. Pt was left in bed with all needs in reach and alarm set. Nurse was notified.    Jesus Stuart Mobility Specialist 12/16/19, 12:28 PM

## 2019-12-16 NOTE — Progress Notes (Signed)
   12/16/19 0734  Assess: MEWS Score  Temp (!) 101.4 F (38.6 C)  BP (!) 127/52  Pulse Rate (!) 42  Resp 18  Level of Consciousness Alert  SpO2 97 %  O2 Device Room Air  Assess: MEWS Score  MEWS Temp 1  MEWS Systolic 0  MEWS Pulse 1  MEWS RR 0  MEWS LOC 0  MEWS Score 2  MEWS Score Color Yellow  Assess: if the MEWS score is Yellow or Red  Were vital signs taken at a resting state? Yes  Focused Assessment No change from prior assessment  Early Detection of Sepsis Score *See Row Information* Low  MEWS guidelines implemented *See Row Information* Yes  Treat  MEWS Interventions Escalated (See documentation below)  Pain Scale 0-10  Pain Score 0  Take Vital Signs  Increase Vital Sign Frequency  Yellow: Q 2hr X 2 then Q 4hr X 2, if remains yellow, continue Q 4hrs  Escalate  MEWS: Escalate Yellow: discuss with charge nurse/RN and consider discussing with provider and RRT  Notify: Charge Nurse/RN  Name of Charge Nurse/RN Notified Erica`  Date Charge Nurse/RN Notified 12/16/19  Time Charge Nurse/RN Notified 0840  Notify: Provider  Provider Name/Title Dr. Kurtis Bushman  Date Provider Notified 12/16/19  Time Provider Notified [phone]  Notification Type Page  Notification Reason Other (Comment) (met order parameters)  Document  Patient Outcome Stabilized after interventions  Progress note created (see row info) Yes

## 2019-12-16 NOTE — Care Management Important Message (Signed)
Important Message  Patient Details  Name: MERREL CRABBE MRN: [id number] Date of Birth: 05-18-1930   Medicare Important Message Given:  Yes     Johnell Comings 12/16/2019, 1:30 PM

## 2019-12-16 NOTE — Progress Notes (Signed)
PROGRESS NOTE    Jesus Stuart  GUY:[id number] DOB: [birth year] DOA: 12/13/2019 PCP: Kirk Ruths, MD    Brief Narrative:  Jesus Kampf Sheltonis a 86 y.o.malewith medical history significant ofCOPD, hypertension, BPH, coronary artery disease, recent cardiac cath with stent placement about 5 days ago presented to hospital with complaints of dysuria, fever and chills with a temperature of around 102.5 F noted by EMS.  Patient met sepsis criteria so was admitted to hospital.  In the ED since initial temperature was 100.2 F with mild tachycardia and respiratory rate of 30/min.  10/28-febrile this am  tmax 101.4   Consultants:     Procedures:   Antimicrobials:   Rocephin    Subjective: States feels much better today.  Feels weak still.  No shortness of breath, abdominal pain, nausea vomiting  Objective: Vitals:   12/15/19 2153 12/15/19 2230 12/16/19 0424 12/16/19 0734  BP: (!) 150/64 130/68 (!) 151/65 (!) 127/52  Pulse: 68 (!) 59 92 (!) 42  Resp: 18 20 16 18   Temp: 99.1 F (37.3 C) 99.5 F (37.5 C) 98.4 F (36.9 C) (!) 101.4 F (38.6 C)  TempSrc: Oral Oral Oral Oral  SpO2: 98% 98% 98% 97%  Weight:      Height:        Intake/Output Summary (Last 24 hours) at 12/16/2019 0847 Last data filed at 12/16/2019 0700 Gross per 24 hour  Intake 1887.63 ml  Output 1250 ml  Net 637.63 ml   Filed Weights   12/13/19 1714  Weight: 72.2 kg    Examination: Calm and comfortable NAD, appears better today CTA, no wheeze rales  RRR S1-S2 no gallop Benign, +bs No edema Alert oriented x3 grossly intact Mood and affect appropriate in current setting   Data Reviewed: I have personally reviewed following labs and imaging studies  CBC: Recent Labs  Lab 12/13/19 1722 12/14/19 0404 12/15/19 1004  WBC 8.1 14.2* 8.4  NEUTROABS 7.1  --   --   HGB 13.2 11.5* 11.1*  HCT 37.8* 32.9* 32.8*  MCV 93.3 94.0 96.5  PLT 262 213 563   Basic Metabolic Panel: Recent  Labs  Lab 12/09/19 0855 12/13/19 1722 12/14/19 0404 12/15/19 1004  NA 137 130* 132* 135  K 4.3 3.1* 3.1* 3.9  CL 104 99 102 102  CO2 23 18* 22 24  GLUCOSE 111* 143* 144* 165*  BUN 18 32* 27* 26*  CREATININE 1.13 2.49* 2.04* 1.60*  CALCIUM 8.8* 8.2* 7.7* 8.1*   GFR: Estimated Creatinine Clearance: 33.1 mL/min (A) (by C-G formula based on SCr of 1.6 mg/dL (H)). Liver Function Tests: Recent Labs  Lab 12/13/19 1722 12/14/19 0404  AST 25 25  ALT 17 16  ALKPHOS 66 53  BILITOT 1.7* 1.2  PROT 7.1 5.8*  ALBUMIN 3.6 2.8*   No results for input(s): LIPASE, AMYLASE in the last 168 hours. No results for input(s): AMMONIA in the last 168 hours. Coagulation Profile: Recent Labs  Lab 12/13/19 1722 12/14/19 0404  INR 1.1 1.1   Cardiac Enzymes: No results for input(s): CKTOTAL, CKMB, CKMBINDEX, TROPONINI in the last 168 hours. BNP (last 3 results) No results for input(s): PROBNP in the last 8760 hours. HbA1C: No results for input(s): HGBA1C in the last 72 hours. CBG: No results for input(s): GLUCAP in the last 168 hours. Lipid Profile: No results for input(s): CHOL, HDL, LDLCALC, TRIG, CHOLHDL, LDLDIRECT in the last 72 hours. Thyroid Function Tests: No results for input(s): TSH, T4TOTAL, FREET4, T3FREE, THYROIDAB in  the last 72 hours. Anemia Panel: No results for input(s): VITAMINB12, FOLATE, FERRITIN, TIBC, IRON, RETICCTPCT in the last 72 hours. Sepsis Labs: Recent Labs  Lab 12/13/19 1722 12/13/19 2235 12/14/19 0305 12/14/19 0404  PROCALCITON  --   --   --  31.72  LATICACIDVEN 2.3* 3.1* 1.4  --     Recent Results (from the past 240 hour(s))  SARS CORONAVIRUS 2 (TAT 6-24 HRS) Nasopharyngeal Nasopharyngeal Swab     Status: None   Collection Time: 12/06/19 11:30 AM   Specimen: Nasopharyngeal Swab  Result Value Ref Range Status   SARS Coronavirus 2 NEGATIVE NEGATIVE Final    Comment: (NOTE) SARS-CoV-2 target nucleic acids are NOT DETECTED.  The SARS-CoV-2 RNA is  generally detectable in upper and lower respiratory specimens during the acute phase of infection. Negative results do not preclude SARS-CoV-2 infection, do not rule out co-infections with other pathogens, and should not be used as the sole basis for treatment or other patient management decisions. Negative results must be combined with clinical observations, patient history, and epidemiological information. The expected result is Negative.  Fact Sheet for Patients: SugarRoll.be  Fact Sheet for Healthcare Providers: https://www.woods-mathews.com/  This test is not yet approved or cleared by the [place] FDA and  has been authorized for detection and/or diagnosis of SARS-CoV-2 by FDA under an Emergency Use Authorization (EUA). This EUA will remain  in effect (meaning this test can be used) for the duration of the COVID-19 declaration under Se ction 564(b)(1) of the Act, 21 U.S.C. section 360bbb-3(b)(1), unless the authorization is terminated or revoked sooner.  Performed at [facility], [place] [address], [place], [place] [address]   Culture, blood (Routine x 2)     Status: Abnormal   Collection Time: 12/13/19  5:22 PM   Specimen: BLOOD  Result Value Ref Range Status   Specimen Description   Final    BLOOD BLOOD RIGHT FOREARM Performed at [facility], [address], [place], [place] [address]    Special Requests   Final    BOTTLES DRAWN AEROBIC AND ANAEROBIC Blood Culture adequate volume Performed at [facility], [address], [place], [place] [address]    Culture  Setup Time   Final    GRAM NEGATIVE RODS IN BOTH AEROBIC AND ANAEROBIC BOTTLES CRITICAL RESULT CALLED TO, READ BACK BY AND VERIFIED WITH: Dorena Bodo @ 0451 12/14/19 RH Performed at [facility], [place] [address], [place], [place] [address]    Culture ESCHERICHIA COLI (A)  Final   Report Status 12/16/2019 FINAL  Final   Organism  ID, Bacteria ESCHERICHIA COLI  Final      Susceptibility   Escherichia coli - MIC*    AMPICILLIN >=32 RESISTANT Resistant     CEFAZOLIN <=4 SENSITIVE Sensitive     CEFEPIME <=0.12 SENSITIVE Sensitive     CEFTAZIDIME <=1 SENSITIVE Sensitive     CEFTRIAXONE <=0.25 SENSITIVE Sensitive     CIPROFLOXACIN <=0.25 SENSITIVE Sensitive     GENTAMICIN <=1 SENSITIVE Sensitive     IMIPENEM <=0.25 SENSITIVE Sensitive     TRIMETH/SULFA <=20 SENSITIVE Sensitive     AMPICILLIN/SULBACTAM >=32 RESISTANT Resistant     PIP/TAZO <=4 SENSITIVE Sensitive     * ESCHERICHIA COLI  Respiratory Panel by RT PCR (Flu A&B, Covid) - Nasopharyngeal Swab     Status: None   Collection Time: 12/13/19  5:22 PM   Specimen: Nasopharyngeal Swab  Result Value Ref Range Status   SARS  Coronavirus 2 by RT PCR NEGATIVE NEGATIVE Final    Comment: (NOTE) SARS-CoV-2 target nucleic acids are NOT DETECTED.  The SARS-CoV-2 RNA is generally detectable in upper respiratoy specimens during the acute phase of infection. The lowest concentration of SARS-CoV-2 viral copies this assay can detect is 131 copies/mL. A negative result does not preclude SARS-Cov-2 infection and should not be used as the sole basis for treatment or other patient management decisions. A negative result may occur with  improper specimen collection/handling, submission of specimen other than nasopharyngeal swab, presence of viral mutation(s) within the areas targeted by this assay, and inadequate number of viral copies (<131 copies/mL). A negative result must be combined with clinical observations, patient history, and epidemiological information. The expected result is Negative.  Fact Sheet for Patients:  PinkCheek.be  Fact Sheet for Healthcare Providers:  GravelBags.it  This test is no t yet approved or cleared by the [place] FDA and  has been authorized for detection and/or diagnosis of  SARS-CoV-2 by FDA under an Emergency Use Authorization (EUA). This EUA will remain  in effect (meaning this test can be used) for the duration of the COVID-19 declaration under Section 564(b)(1) of the Act, 21 U.S.C. section 360bbb-3(b)(1), unless the authorization is terminated or revoked sooner.     Influenza A by PCR NEGATIVE NEGATIVE Final   Influenza B by PCR NEGATIVE NEGATIVE Final    Comment: (NOTE) The Xpert Xpress SARS-CoV-2/FLU/RSV assay is intended as an aid in  the diagnosis of influenza from Nasopharyngeal swab specimens and  should not be used as a sole basis for treatment. Nasal washings and  aspirates are unacceptable for Xpert Xpress SARS-CoV-2/FLU/RSV  testing.  Fact Sheet for Patients: PinkCheek.be  Fact Sheet for Healthcare Providers: GravelBags.it  This test is not yet approved or cleared by the [place] FDA and  has been authorized for detection and/or diagnosis of SARS-CoV-2 by  FDA under an Emergency Use Authorization (EUA). This EUA will remain  in effect (meaning this test can be used) for the duration of the  Covid-19 declaration under Section 564(b)(1) of the Act, 21  U.S.C. section 360bbb-3(b)(1), unless the authorization is  terminated or revoked. Performed at [facility], [address] [place], [place] [address]   Blood Culture ID Panel (Reflexed)     Status: Abnormal   Collection Time: 12/13/19  5:22 PM  Result Value Ref Range Status   Enterococcus faecalis NOT DETECTED NOT DETECTED Final   Enterococcus Faecium NOT DETECTED NOT DETECTED Final   Listeria monocytogenes NOT DETECTED NOT DETECTED Final   Staphylococcus species NOT DETECTED NOT DETECTED Final   Staphylococcus aureus (BCID) NOT DETECTED NOT DETECTED Final   Staphylococcus epidermidis NOT DETECTED NOT DETECTED Final   Staphylococcus lugdunensis NOT DETECTED NOT DETECTED Final   Streptococcus species NOT  DETECTED NOT DETECTED Final   Streptococcus agalactiae NOT DETECTED NOT DETECTED Final   Streptococcus pneumoniae NOT DETECTED NOT DETECTED Final   Streptococcus pyogenes NOT DETECTED NOT DETECTED Final   A.calcoaceticus-baumannii NOT DETECTED NOT DETECTED Final   Bacteroides fragilis NOT DETECTED NOT DETECTED Final   Enterobacterales DETECTED (A) NOT DETECTED Final    Comment: Enterobacterales represent a large order of gram negative bacteria, not a single organism. CRITICAL RESULT CALLED TO, READ BACK BY AND VERIFIED WITH: ABBY ELLINGTON @ 0451 12/14/19 RH    Enterobacter cloacae complex NOT DETECTED NOT DETECTED Final   Escherichia coli DETECTED (A) NOT DETECTED Final    Comment: CRITICAL RESULT CALLED  TO, READ BACK BY AND VERIFIED WITH: ABBY ELLINGTON @ 0451 12/14/19 RH    Klebsiella aerogenes NOT DETECTED NOT DETECTED Final   Klebsiella oxytoca NOT DETECTED NOT DETECTED Final   Klebsiella pneumoniae NOT DETECTED NOT DETECTED Final   Proteus species NOT DETECTED NOT DETECTED Final   Salmonella species NOT DETECTED NOT DETECTED Final   Serratia marcescens NOT DETECTED NOT DETECTED Final   Haemophilus influenzae NOT DETECTED NOT DETECTED Final   Neisseria meningitidis NOT DETECTED NOT DETECTED Final   Pseudomonas aeruginosa NOT DETECTED NOT DETECTED Final   Stenotrophomonas maltophilia NOT DETECTED NOT DETECTED Final   Candida albicans NOT DETECTED NOT DETECTED Final   Candida auris NOT DETECTED NOT DETECTED Final   Candida glabrata NOT DETECTED NOT DETECTED Final   Candida krusei NOT DETECTED NOT DETECTED Final   Candida parapsilosis NOT DETECTED NOT DETECTED Final   Candida tropicalis NOT DETECTED NOT DETECTED Final   Cryptococcus neoformans/gattii NOT DETECTED NOT DETECTED Final   CTX-M ESBL NOT DETECTED NOT DETECTED Final   Carbapenem resistance IMP NOT DETECTED NOT DETECTED Final   Carbapenem resistance KPC NOT DETECTED NOT DETECTED Final   Carbapenem resistance NDM NOT  DETECTED NOT DETECTED Final   Carbapenem resist OXA 48 LIKE NOT DETECTED NOT DETECTED Final   Carbapenem resistance VIM NOT DETECTED NOT DETECTED Final    Comment: Performed at [facility], [address] [place], [place] [address]  Culture, blood (Routine x 2)     Status: Abnormal   Collection Time: 12/13/19  5:28 PM   Specimen: BLOOD  Result Value Ref Range Status   Specimen Description   Final    BLOOD BLOOD RIGHT FOREARM Performed at [facility], [address] [place], [place] [address]    Special Requests   Final    BOTTLES DRAWN AEROBIC AND ANAEROBIC Blood Culture adequate volume Performed at [facility], [address] [place], [place] [address]    Culture  Setup Time   Final    GRAM NEGATIVE RODS AEROBIC BOTTLE ONLY CRITICAL VALUE NOTED.  VALUE IS CONSISTENT WITH PREVIOUSLY REPORTED AND CALLED VALUE. Performed at [facility], [address] [place], [place] [address]    Culture (A)  Final    ESCHERICHIA COLI SUSCEPTIBILITIES PERFORMED ON PREVIOUS CULTURE WITHIN THE LAST 5 DAYS. Performed at [facility], [place] [address], [place], [place] [address]    Report Status 12/16/2019 FINAL  Final  Urine culture     Status: Abnormal (Preliminary result)   Collection Time: 12/13/19  6:19 PM   Specimen: Urine, Random  Result Value Ref Range Status   Specimen Description   Final    URINE, RANDOM Performed at [facility], [address] [place], [place] [address]    Special Requests   Final    NONE Performed at [facility], [address] [place], [place] [address]    Culture (A)  Final    >=100,000 COLONIES/mL ESCHERICHIA COLI SUSCEPTIBILITIES TO FOLLOW Performed at [facility], [place] [address] [place],  [address]    Report Status PENDING  Incomplete         Radiology Studies: US RENAL  Result Date: 12/14/2019 CLINICAL DATA:  Sepsis of a urinary source EXAM: RENAL / URINARY  TRACT ULTRASOUND COMPLETE COMPARISON:  None. FINDINGS: Right Kidney: Renal measurements: 11.4 x 6.1 x 5.3 cm = volume: 194 mL. Renal cortical echogenicity within normal limits. Anechoic partially exophytic simple appearing cyst measuring  3.0 x 2.5 x 2.7 cm arising from the lower pole right kidney. No obstructive urolithiasis or hydronephrosis. No worrisome renal lesion. Left Kidney: Renal measurements: 11.9 x 5.4 x 4.6 cm = volume: 154 mL. Renal cortical echogenicity within normal limits. Anechoic, partially exophytic simple appearing cyst measuring 4.1 x 2.9 x 4.1 cm arising from the upper pole left kidney. No obstructive urolithiasis or hydronephrosis. No worrisome renal lesion. Bladder: Partially decompressed at the time of examination with a prevoid volume of 102.08 and a postvoid residual of 95 cc. Right bladder jet is identified. The left bladder jet is not visualized though is nonspecific in the absence of other features of obstruction. Slight bladder trabeculation is noted. Mild wall thickening though possibly secondary to underdistention. Other: Incidental note made of prostatomegaly with the prostate measuring approximately 5 x 4 x 5.7 cm for an estimated volume of 59.73 mL. IMPRESSION: 1. Simple appearing bilateral renal cysts. 2. No obstructive urolithiasis or hydronephrosis. 3. Prostatomegaly. 4. Bladder trabeculation and mild wall thickening may be related to underdistention and/or chronic outlet obstruction though could correlate with sepsis of urinary source. Postvoid residual of 95 CC. Electronically Signed   By: Lovena Le M.D.   On: 12/14/2019 15:53        Scheduled Meds: . aspirin EC  81 mg Oral QHS  . atorvastatin  40 mg Oral Daily  . clopidogrel  75 mg Oral QHS  . enoxaparin (LOVENOX) injection  40 mg Subcutaneous Q24H  . isosorbide mononitrate  30 mg Oral QPM  . melatonin  5 mg Oral QHS  . metoprolol succinate  12.5 mg Oral QPM  . mometasone-formoterol  2 puff Inhalation BID    . pantoprazole  40 mg Oral QHS  . sodium chloride flush  3 mL Intravenous Q12H  . tamsulosin  0.4 mg Oral QPC supper   Continuous Infusions: . sodium chloride 75 mL/hr at 12/16/19 0731  . cefTRIAXone (ROCEPHIN)  IV Stopped (12/15/19 1819)    Assessment & Plan:   Principal Problem:   Sepsis due to gram-negative UTI ([place]) Active Problems:   COPD (chronic obstructive pulmonary disease) (H[facility])   Essential hypertension   Hypokalemia   Hyponatremia   AKI (acute kidney injury) ([place])   E.Coli Urosepsis- with bacteremia- Patient presented with signs of sepsis including leukocytosis, fever, tachypnea, elevated lactate, acute kidney injury. Blood cx and ucx both e.coli, sensitivities pending Leukocytosis improved. However febrile this am, will change rocephin to meropenem  ID consulted Renal US without hydronephrosis emprically started on flomax     COPD:Not in  acute exacerbation. Continue Dulera and inhalers   Hyponatremia: Mild.  Secondary to dehydration/from urosepsis Improved with IV fluids, sodium 135 today   Mild hypokalemia.  Was replaced and now stable K is 3.9 today    AKI: Creatinine on presentation at 2.4.  Likely prerenal 2/2 dehydration/sepsis Improved with ivf  Creatinine today 1.11 cotninue to hold losartan and HCTZ     History of coronary artery disease:Status post recent catheterization 5 days back status post coronary stent.Marland Kitchen  asx Continue with medical regimen Beta blk was decreased due to bradycardia    Essential hypertension:   stable Continue to monitor   DVT prophylaxis: Lovenox Code Status: Full Family Communication: daughter updated via phone  Status is: Inpatient  Remains inpatient appropriate because:IV treatments appropriate due to intensity of illness or inability to take PO   Dispo: The patient is from: Home  Anticipated d/c is to: Home              Anticipated d/c date is: 2 days               Patient currently is not medically stable to d/c.febrile today, on iv abx. ID consulted           LOS: 3 days   Time spent: 35 minutes with more than 50% on Mikes, MD [facility] Pager [phone]  If 7PM-7AM, please contact night-coverage [email or web address] Password [facility] 12/16/2019, 8:47 AM

## 2019-12-17 DIAGNOSIS — A419 Sepsis, unspecified organism: Secondary | ICD-10-CM

## 2019-12-17 DIAGNOSIS — N390 Urinary tract infection, site not specified: Secondary | ICD-10-CM

## 2019-12-17 DIAGNOSIS — R7881 Bacteremia: Secondary | ICD-10-CM | POA: Diagnosis not present

## 2019-12-17 DIAGNOSIS — A4150 Gram-negative sepsis, unspecified: Secondary | ICD-10-CM | POA: Diagnosis not present

## 2019-12-17 DIAGNOSIS — R6520 Severe sepsis without septic shock: Secondary | ICD-10-CM | POA: Diagnosis not present

## 2019-12-17 DIAGNOSIS — B9620 Unspecified Escherichia coli [E. coli] as the cause of diseases classified elsewhere: Secondary | ICD-10-CM

## 2019-12-17 DIAGNOSIS — N179 Acute kidney failure, unspecified: Secondary | ICD-10-CM

## 2019-12-17 LAB — BASIC METABOLIC PANEL
Anion gap: 9 (ref 5–15)
BUN: 14 mg/dL (ref 8–23)
CO2: 21 mmol/L — ABNORMAL LOW (ref 22–32)
Calcium: 7.7 mg/dL — ABNORMAL LOW (ref 8.9–10.3)
Chloride: 104 mmol/L (ref 98–111)
Creatinine, Ser: 1.07 mg/dL (ref 0.61–1.24)
GFR, Estimated: >60 mL/min (ref >60)
Glucose, Bld: 114 mg/dL — ABNORMAL HIGH (ref 70–99)
Potassium: 3.2 mmol/L — ABNORMAL LOW (ref 3.5–5.1)
Sodium: 134 mmol/L — ABNORMAL LOW (ref 135–145)

## 2019-12-17 LAB — PROCALCITONIN: Procalcitonin: 3.9 ng/mL

## 2019-12-17 MED ORDER — SODIUM CHLORIDE 0.9 % IV SOLN
INTRAVENOUS | Status: DC | PRN
  Administered 2019-12-17: 250 mL via INTRAVENOUS

## 2019-12-17 MED ORDER — POTASSIUM CHLORIDE CRYS ER 20 MEQ PO TBCR
40.0000 meq | EXTENDED_RELEASE_TABLET | Freq: Once | ORAL | Status: AC
  Administered 2019-12-17: 40 meq via ORAL
  Filled 2019-12-17: qty 2

## 2019-12-17 MED ORDER — CEFAZOLIN SODIUM-DEXTROSE 2-4 GM/100ML-% IV SOLN
2.0000 g | Freq: Three times a day (TID) | INTRAVENOUS | Status: DC
  Administered 2019-12-17: 2 g via INTRAVENOUS
  Filled 2019-12-17 (×4): qty 100

## 2019-12-17 MED ORDER — SODIUM CHLORIDE 0.9 % IV SOLN
2.0000 g | INTRAVENOUS | Status: DC
  Filled 2019-12-17: qty 20

## 2019-12-17 MED ORDER — SODIUM CHLORIDE 0.9 % IV SOLN
1.0000 g | Freq: Two times a day (BID) | INTRAVENOUS | Status: DC
  Administered 2019-12-17: 1 g via INTRAVENOUS
  Filled 2019-12-17 (×3): qty 1

## 2019-12-17 NOTE — Consult Note (Signed)
NAME: Jesus Stuart  DOB: 08-03-1930  MRN: [id number]  Date/Time: 12/17/2019 12:29 PM  REQUESTING PROVIDER: Dr.Amery Subjective:  REASON FOR CONSULT: e.coli bacteremia ? Jesus Stuart is a 86 y.o. male with a history of HTN, CAD S/P CABG, BPH presented to the ED on 12/13/19 with fever, fatigue and chills As per patient he had cardiac cath on 12/08/19 and had stents placed to RCA. Post procedure he had to lie flat for 4 hrs and his bladder was full and he couldn't pass urine lying down and they had to put a foley catheter for a few hours. When he went home he says he passed some junk in his urine which he thought was due to unblocking of the coronary artery. It was red in color. HE then had dysuria for few days and the day before he came to the hospital was having chills and fever.He was also having constipation. He thought the new medicine lipitor which he had never taken before the stent was making him tired. He ahs a history of prostate enlargement but that has not given him any trouble in the past many years.  In the ED vitals 100.2, BP 99/53,procal was 31, wbc 14.2, cr 2.04. He was started on ceftriaxone. US done on 12/14/19 showed No obstructive urolithiasis or hydronephrosis. There was  Prostatomegaly and  Bladder trabeculation and mild wall thickening may be related to underdistention and/or chronic outlet obstruction . Postvoid residual of 95 CC.  On 10/28 he developed a fever of 101.4 and the ceftriaxone was switched to meropenem- The blood culture is e.coli which is sensitive except to augmentin The urine culture e.coli but sensi not available Pt says he is feeling better today after not being well for the past 4 days. More energy and he walked   Past Medical History:  Diagnosis Date  . BPH (benign prostatic hyperplasia)   . COPD (chronic obstructive pulmonary disease) (HCC)   . Hypertension   . Reactive airway disease     Past Surgical History:  Procedure Laterality Date    . CORONARY ARTERY BYPASS GRAFT    . CORONARY STENT INTERVENTION N/A 12/08/2019   Procedure: CORONARY STENT INTERVENTION;  Surgeon: Alwyn Pea, MD;  Location: [facility] INVASIVE CV LAB;  Service: Cardiovascular;  Laterality: N/A;  . LEFT HEART CATH AND CORONARY ANGIOGRAPHY Left 12/08/2019   Procedure: LEFT HEART CATH AND CORONARY ANGIOGRAPHY;  Surgeon: Alwyn Pea, MD;  Location: [facility] INVASIVE CV LAB;  Service: Cardiovascular;  Laterality: Left;    Social History   Socioeconomic History  . Marital status: Married    Spouse name: Not on file  . Number of children: Not on file  . Years of education: Not on file  . Highest education level: Not on file  Occupational History  . Not on file  Tobacco Use  . Smoking status: Former Games developer  . Smokeless tobacco: Never Used  . Tobacco comment: quit 29yrs ago  Vaping Use  . Vaping Use: Never used  Substance and Sexual Activity  . Alcohol use: Not on file  . Drug use: Never  . Sexual activity: Never  Other Topics Concern  . Not on file  Social History Narrative  . Not on file   Social Determinants of Health   Financial Resource Strain:   . Difficulty of Paying Living Expenses: Not on file  Food Insecurity:   . Worried About Programme researcher, broadcasting/film/video in the Last Year: Not on file  . Ran Out  of Food in the Last Year: Not on file  Transportation Needs:   . Lack of Transportation (Medical): Not on file  . Lack of Transportation (Non-Medical): Not on file  Physical Activity:   . Days of Exercise per Week: Not on file  . Minutes of Exercise per Session: Not on file  Stress:   . Feeling of Stress : Not on file  Social Connections:   . Frequency of Communication with Friends and Family: Not on file  . Frequency of Social Gatherings with Friends and Family: Not on file  . Attends Religious Services: Not on file  . Active Member of Clubs or Organizations: Not on file  . Attends Banker Meetings: Not on file  . Marital  Status: Not on file  Intimate Partner Violence:   . Fear of Current or Ex-Partner: Not on file  . Emotionally Abused: Not on file  . Physically Abused: Not on file  . Sexually Abused: Not on file    History reviewed. No pertinent family history. Allergies  Allergen Reactions  . Penicillin G Rash    ? Current Facility-Administered Medications  Medication Dose Route Frequency Provider Last Rate Last Admin  . acetaminophen (TYLENOL) tablet 650 mg  650 mg Oral Q6H PRN Rometta Emery, MD   650 mg at 12/16/19 0751   Or  . acetaminophen (TYLENOL) suppository 650 mg  650 mg Rectal Q6H PRN Garba, Mohammad L, MD      . albuterol (VENTOLIN HFA) 108 (90 Base) MCG/ACT inhaler 1-2 puff  1-2 puff Inhalation Q6H PRN Rometta Emery, MD      . aspirin EC tablet 81 mg  81 mg Oral QHS Rometta Emery, MD   81 mg at 12/16/19 2107  . atorvastatin (LIPITOR) tablet 40 mg  40 mg Oral Daily Rometta Emery, MD   40 mg at 12/17/19 0914  . clopidogrel (PLAVIX) tablet 75 mg  75 mg Oral QHS Rometta Emery, MD   75 mg at 12/16/19 2107  . enoxaparin (LOVENOX) injection 40 mg  40 mg Subcutaneous Q24H Katha Cabal, RPH   40 mg at 12/16/19 2107  . isosorbide mononitrate (IMDUR) 24 hr tablet 30 mg  30 mg Oral QPM Lynn Ito, MD      . melatonin tablet 5 mg  5 mg Oral QHS Manuela Schwartz, NP   5 mg at 12/16/19 2107  . meropenem (MERREM) 1 g in sodium chloride 0.9 % 100 mL IVPB  1 g Intravenous Q12H Lynn Ito, MD 200 mL/hr at 12/17/19 1041 1 g at 12/17/19 1041  . metoprolol succinate (TOPROL-XL) 24 hr tablet 12.5 mg  12.5 mg Oral QPM Lynn Ito, MD      . mometasone-formoterol (DULERA) 200-5 MCG/ACT inhaler 2 puff  2 puff Inhalation BID Rometta Emery, MD   2 puff at 12/17/19 0915  . nitroGLYCERIN (NITROSTAT) SL tablet 0.4 mg  0.4 mg Sublingual Q5 Min x 3 PRN Earlie Lou L, MD      . ondansetron (ZOFRAN) tablet 4 mg  4 mg Oral Q6H PRN Rometta Emery, MD       Or  . ondansetron (ZOFRAN)  injection 4 mg  4 mg Intravenous Q6H PRN Earlie Lou L, MD      . pantoprazole (PROTONIX) EC tablet 40 mg  40 mg Oral QHS Rometta Emery, MD   40 mg at 12/16/19 2107  . sodium chloride flush (NS) 0.9 % injection 3 mL  3  mL Intravenous Q12H Rometta Emery, MD   3 mL at 12/17/19 0914  . tamsulosin (FLOMAX) capsule 0.4 mg  0.4 mg Oral QPC supper Pokhrel, Laxman, MD   0.4 mg at 12/16/19 1725     Abtx:  Anti-infectives (From admission, onward)   Start     Dose/Rate Route Frequency Ordered Stop   12/16/19 1000  meropenem (MERREM) 1 g in sodium chloride 0.9 % 100 mL IVPB       Note to Pharmacy: Dosing per pharmacy   1 g 200 mL/hr over 30 Minutes Intravenous Every 12 hours 12/16/19 0848     12/14/19 1800  cefTRIAXone (ROCEPHIN) 2 g in sodium chloride 0.9 % 100 mL IVPB  Status:  Discontinued        2 g 200 mL/hr over 30 Minutes Intravenous Every 24 hours 12/14/19 0520 12/16/19 0848   12/14/19 0015  cefTRIAXone (ROCEPHIN) 1 g in sodium chloride 0.9 % 100 mL IVPB  Status:  Discontinued        1 g 200 mL/hr over 30 Minutes Intravenous Every 24 hours 12/14/19 0011 12/14/19 0521   12/13/19 1830  cefTRIAXone (ROCEPHIN) 1 g in sodium chloride 0.9 % 100 mL IVPB  Status:  Discontinued        1 g 200 mL/hr over 30 Minutes Intravenous Every 24 hours 12/13/19 1829 12/14/19 0520      REVIEW OF SYSTEMS:  Const:  Fever,  chills, negative weight loss Eyes: negative diplopia or visual changes, negative eye pain ENT: negative coryza, negative sore throat Resp: negative cough, hemoptysis, dyspnea Cards: chest pain on minimal exertion resolved after stent GU:as above GI: Negative for abdominal pain, diarrhea, bleeding, constipation Skin: negative for rash and pruritus Heme: negative for easy bruising and gum/nose bleeding MS: weakness Neurolo:negative for headaches, dizziness, vertigo, memory problems  Psych: negative for feelings of anxiety, depression  Endocrine: negative for thyroid,  diabetes Allergy/Immunology- PCN rash Objective:  VITALS:  BP 135/78   Pulse (!) 45   Temp 97.8 F (36.6 C) (Oral)   Resp 20   Ht 5\' 9"  (1.753 m)   Wt 72.2 kg   SpO2 97%   BMI 23.51 kg/m  PHYSICAL EXAM:  General: Alert, cooperative, no distress, appears stated age.  Head: Normocephalic, without obvious abnormality, atraumatic. Eyes: Conjunctivae clear, anicteric sclerae. Pupils are equal ENT Nares normal. No drainage or sinus tenderness. Lips, mucosa, and tongue normal. No Thrush Neck: Supple, symmetrical, no adenopathy, thyroid: non tender no carotid bruit and no JVD. Back: No CVA tenderness. Lungs: Clear to auscultation bilaterally. No Wheezing or Rhonchi. No rales. Heart: Regular rate and rhythm, no murmur, rub or gallop. Sternal scar Abdomen: Soft, non-tender,not distended. Bowel sounds normal. No masses Extremities: atraumatic, no cyanosis. No edema. No clubbing Skin: No rashes or lesions. Or bruising Lymph: Cervical, supraclavicular normal. Neurologic: Grossly non-focal Pertinent Labs Lab Results CBC    Component Value Date/Time   WBC 5.4 12/16/2019 0855   RBC 3.31 (L) 12/16/2019 0855   HGB 10.6 (L) 12/16/2019 0855   HCT 30.9 (L) 12/16/2019 0855   PLT 193 12/16/2019 0855   MCV 93.4 12/16/2019 0855   MCH 32.0 12/16/2019 0855   MCHC 34.3 12/16/2019 0855   RDW 13.2 12/16/2019 0855   LYMPHSABS 0.2 (L) 12/13/2019 1722   MONOABS 0.6 12/13/2019 1722   EOSABS 0.0 12/13/2019 1722   BASOSABS 0.0 12/13/2019 1722    CMP Latest Ref Rng & Units 12/17/2019 12/16/2019 12/15/2019  Glucose 70 - 99 mg/dL  114(H) 122(H) 165(H)  BUN 8 - 23 mg/dL 14 17 16(X)  Creatinine 0.61 - 1.24 mg/dL 0.96 0.45 4.09(W)  Sodium 135 - 145 mmol/L 134(L) 133(L) 135  Potassium 3.5 - 5.1 mmol/L 3.2(L) 3.5 3.9  Chloride 98 - 111 mmol/L 104 103 102  CO2 22 - 32 mmol/L 21(L) 22 24  Calcium 8.9 - 10.3 mg/dL 7.7(L) 7.6(L) 8.1(L)  Total Protein 6.5 - 8.1 g/dL - - -  Total Bilirubin 0.3 - 1.2 mg/dL  - - -  Alkaline Phos 38 - 126 U/L - - -  AST 15 - 41 U/L - - -  ALT 0 - 44 U/L - - -      Microbiology: Recent Results (from the past 240 hour(s))  Culture, blood (Routine x 2)     Status: Abnormal   Collection Time: 12/13/19  5:22 PM   Specimen: BLOOD  Result Value Ref Range Status   Specimen Description   Final    BLOOD BLOOD RIGHT FOREARM Performed at [facility], [address], [place], [place] [address]    Special Requests   Final    BOTTLES DRAWN AEROBIC AND ANAEROBIC Blood Culture adequate volume Performed at [facility], [address], [place], [place] [address]    Culture  Setup Time   Final    GRAM NEGATIVE RODS IN BOTH AEROBIC AND ANAEROBIC BOTTLES CRITICAL RESULT CALLED TO, READ BACK BY AND VERIFIED WITH: ABBY ELLINGTON @ 0451 12/14/19 RH Performed at [facility], [address], [place], [place] [address]    Culture ESCHERICHIA COLI (A)  Final   Report Status 12/16/2019 FINAL  Final   Organism ID, Bacteria ESCHERICHIA COLI  Final      Susceptibility   Escherichia coli - MIC*    AMPICILLIN >=32 RESISTANT Resistant     CEFAZOLIN <=4 SENSITIVE Sensitive     CEFEPIME <=0.12 SENSITIVE Sensitive     CEFTAZIDIME <=1 SENSITIVE Sensitive     CEFTRIAXONE <=0.25 SENSITIVE Sensitive     CIPROFLOXACIN <=0.25 SENSITIVE Sensitive     GENTAMICIN <=1 SENSITIVE Sensitive     IMIPENEM <=0.25 SENSITIVE Sensitive     TRIMETH/SULFA <=20 SENSITIVE Sensitive     AMPICILLIN/SULBACTAM >=32 RESISTANT Resistant     PIP/TAZO <=4 SENSITIVE Sensitive     * ESCHERICHIA COLI  Respiratory Panel by RT PCR (Flu A&B, Covid) - Nasopharyngeal Swab     Status: None   Collection Time: 12/13/19  5:22 PM   Specimen: Nasopharyngeal Swab  Result Value Ref Range Status   SARS Coronavirus 2 by RT PCR NEGATIVE NEGATIVE Final    Comment: (NOTE) SARS-CoV-2 target nucleic acids are NOT DETECTED.  The SARS-CoV-2 RNA is generally detectable in upper respiratoy specimens  during the acute phase of infection. The lowest concentration of SARS-CoV-2 viral copies this assay can detect is 131 copies/mL. A negative result does not preclude SARS-Cov-2 infection and should not be used as the sole basis for treatment or other patient management decisions. A negative result may occur with  improper specimen collection/handling, submission of specimen other than nasopharyngeal swab, presence of viral mutation(s) within the areas targeted by this assay, and inadequate number of viral copies (<131 copies/mL). A negative result must be combined with clinical observations, patient history, and epidemiological information. The expected result is Negative.  Fact Sheet for Patients:  https://www.moore.com/  Fact Sheet for Healthcare Providers:  https://www.young.biz/  This test is no t yet approved or cleared by the [place] FDA  and  has been authorized for detection and/or diagnosis of SARS-CoV-2 by FDA under an Emergency Use Authorization (EUA). This EUA will remain  in effect (meaning this test can be used) for the duration of the COVID-19 declaration under Section 564(b)(1) of the Act, 21 U.S.C. section 360bbb-3(b)(1), unless the authorization is terminated or revoked sooner.     Influenza A by PCR NEGATIVE NEGATIVE Final   Influenza B by PCR NEGATIVE NEGATIVE Final    Comment: (NOTE) The Xpert Xpress SARS-CoV-2/FLU/RSV assay is intended as an aid in  the diagnosis of influenza from Nasopharyngeal swab specimens and  should not be used as a sole basis for treatment. Nasal washings and  aspirates are unacceptable for Xpert Xpress SARS-CoV-2/FLU/RSV  testing.  Fact Sheet for Patients: https://www.moore.com/  Fact Sheet for Healthcare Providers: https://www.young.biz/  This test is not yet approved or cleared by the [place] FDA and  has been authorized for detection and/or  diagnosis of SARS-CoV-2 by  FDA under an Emergency Use Authorization (EUA). This EUA will remain  in effect (meaning this test can be used) for the duration of the  Covid-19 declaration under Section 564(b)(1) of the Act, 21  U.S.C. section 360bbb-3(b)(1), unless the authorization is  terminated or revoked. Performed at [facility], [address] [place], [place] [address]   Blood Culture ID Panel (Reflexed)     Status: Abnormal   Collection Time: 12/13/19  5:22 PM  Result Value Ref Range Status   Enterococcus faecalis NOT DETECTED NOT DETECTED Final   Enterococcus Faecium NOT DETECTED NOT DETECTED Final   Listeria monocytogenes NOT DETECTED NOT DETECTED Final   Staphylococcus species NOT DETECTED NOT DETECTED Final   Staphylococcus aureus (BCID) NOT DETECTED NOT DETECTED Final   Staphylococcus epidermidis NOT DETECTED NOT DETECTED Final   Staphylococcus lugdunensis NOT DETECTED NOT DETECTED Final   Streptococcus species NOT DETECTED NOT DETECTED Final   Streptococcus agalactiae NOT DETECTED NOT DETECTED Final   Streptococcus pneumoniae NOT DETECTED NOT DETECTED Final   Streptococcus pyogenes NOT DETECTED NOT DETECTED Final   A.calcoaceticus-baumannii NOT DETECTED NOT DETECTED Final   Bacteroides fragilis NOT DETECTED NOT DETECTED Final   Enterobacterales DETECTED (A) NOT DETECTED Final    Comment: Enterobacterales represent a large order of gram negative bacteria, not a single organism. CRITICAL RESULT CALLED TO, READ BACK BY AND VERIFIED WITH: ABBY ELLINGTON @ 0451 12/14/19 RH    Enterobacter cloacae complex NOT DETECTED NOT DETECTED Final   Escherichia coli DETECTED (A) NOT DETECTED Final    Comment: CRITICAL RESULT CALLED TO, READ BACK BY AND VERIFIED WITH: ABBY ELLINGTON @ 0451 12/14/19 RH    Klebsiella aerogenes NOT DETECTED NOT DETECTED Final   Klebsiella oxytoca NOT DETECTED NOT DETECTED Final   Klebsiella pneumoniae NOT DETECTED NOT DETECTED Final   Proteus  species NOT DETECTED NOT DETECTED Final   Salmonella species NOT DETECTED NOT DETECTED Final   Serratia marcescens NOT DETECTED NOT DETECTED Final   Haemophilus influenzae NOT DETECTED NOT DETECTED Final   Neisseria meningitidis NOT DETECTED NOT DETECTED Final   Pseudomonas aeruginosa NOT DETECTED NOT DETECTED Final   Stenotrophomonas maltophilia NOT DETECTED NOT DETECTED Final   Candida albicans NOT DETECTED NOT DETECTED Final   Candida auris NOT DETECTED NOT DETECTED Final   Candida glabrata NOT DETECTED NOT DETECTED Final   Candida krusei NOT DETECTED NOT DETECTED Final   Candida parapsilosis NOT DETECTED NOT DETECTED Final   Candida tropicalis NOT DETECTED NOT DETECTED Final  Cryptococcus neoformans/gattii NOT DETECTED NOT DETECTED Final   CTX-M ESBL NOT DETECTED NOT DETECTED Final   Carbapenem resistance IMP NOT DETECTED NOT DETECTED Final   Carbapenem resistance KPC NOT DETECTED NOT DETECTED Final   Carbapenem resistance NDM NOT DETECTED NOT DETECTED Final   Carbapenem resist OXA 48 LIKE NOT DETECTED NOT DETECTED Final   Carbapenem resistance VIM NOT DETECTED NOT DETECTED Final    Comment: Performed at [facility], [address] [place], [place] [address]  Culture, blood (Routine x 2)     Status: Abnormal   Collection Time: 12/13/19  5:28 PM   Specimen: BLOOD  Result Value Ref Range Status   Specimen Description   Final    BLOOD BLOOD RIGHT FOREARM Performed at [facility], [address], [place], [place] [address]    Special Requests   Final    BOTTLES DRAWN AEROBIC AND ANAEROBIC Blood Culture adequate volume Performed at [facility], [address] [place], [place] [address]    Culture  Setup Time   Final    GRAM NEGATIVE RODS AEROBIC BOTTLE ONLY CRITICAL VALUE NOTED.  VALUE IS CONSISTENT WITH PREVIOUSLY REPORTED AND CALLED VALUE. Performed at [facility], [address] [place], [place] [address]    Culture (A)  Final      ESCHERICHIA COLI SUSCEPTIBILITIES PERFORMED ON PREVIOUS CULTURE WITHIN THE LAST 5 DAYS. Performed at [facility], [address] [place], [place] [address]    Report Status 12/16/2019 FINAL  Final  Urine culture     Status: Abnormal (Preliminary result)   Collection Time: 12/13/19  6:19 PM   Specimen: Urine, Random  Result Value Ref Range Status   Specimen Description   Final    URINE, RANDOM Performed at [facility], [address], [place], [place] [address]    Special Requests   Final    NONE Performed at [facility], [address], [place], [place] [address]    Culture (A)  Final    >=100,000 COLONIES/mL ESCHERICHIA COLI SUSCEPTIBILITIES TO FOLLOW Performed at [facility], [address] [place], [place] [address]    Report Status PENDING  Incomplete    IMAGING RESULTS:  I have personally reviewed the films ? Impression/Recommendation ? ?E.coli bacteremia with sepsis with UTI following a recent foley catheter inserting a week before presentation US showed prostatomegaly. Will check post void bladder scan to make sure no significant residual urine Pt was initially started on ceftriaxone and after 4 days had a fever spike and changed to meropenem yesterday and doing well The E.coli in blood culture is pretty pansensitive but urine e.coli susceptibility is still pending. So until that is available will continue with meropenem and not change change to cefazolin as advised before. Also would choose an antibiotic with better prostate tissue concentration Levaquin/bactrim are other options Pt is concerned that he is not ready for discharge as he feels weak. Spoke to his daughter who also raised the issue of him not having much help at home as his wife is 7 yrs old. Discussed the home treatment options -levaquin ( need to make sure there is no prolonged QT in the EKG- may do a new EKG and also get cardiology to opine if we have to use  this drug also other side effects including muscle , tendon effect- though he may not take for more than a week,) PO Bactrim or IV ceftriaxone Will avoid oral  Keflex  AKI on CKD - improved  CAD s/p stent RCA- on lipitor, plavix  Anemia  BPH on tamsulosin ? ___________________________________________________ Discussed with patient,daughter and  requesting provider ID will follow him peripherally this weekend- call me on my cell if any questions Note:  This document was prepared using Dragon voice recognition software and may include unintentional dictation errors.

## 2019-12-17 NOTE — Progress Notes (Signed)
Mobility Specialist - Progress Note   12/17/19 1213  Mobility  Activity Ambulated in room;Ambulated in hall  Level of Assistance Independent  Assistive Device None  Distance Ambulated (ft) 360 ft  Mobility Response Tolerated well  Mobility performed by Mobility specialist  $Mobility charge 1 Mobility   Pt laying in bed upon arrival. Pt agreed to session. Pt independent putting on socks. Pt independent w/ bed mobility. Pt ambulated 360' total in room and hallway independently w/o AD. No LOB noted. No c/o pain or SOB. O2 and HR monitored t/o session. O2 sat > 95% t/o session. Pt on RA. HR trending between 90s-110s during ambulation. Overall, pt tolerated session well. Pt pleasant and motivated t/o session. Pt left laying in bed w/ all needs placed in reach. Nurse was notified.     Lilo Wallington Mobility Specialist  12/17/19, 12:16 PM

## 2019-12-17 NOTE — Progress Notes (Addendum)
Pharmacy Antibiotic Note  Jesus Stuart is a 86 y.o. male admitted on 12/13/2019 with E. Coli bacteremia from urinary source. Patient was on ceftriaxone 10/25 - 10/28 and subsequently escalated to meropenem for fever x 1 on 10/28. Patient was then de-escalated to cefazolin based on blood culture sensitivities. However, upon further consideration by ID, now changing back to meropenem while awaiting urine culture sensitivities to confirm non-ESBL organism. There is further concern for prostatitis. Pharmacy has been consulted for meropenem dosing.   There is consideration for sending patient home on levofloxacin to complete antibiotic course. No significant DDI identified. EKG from 10/25 with QTc 563. Could consider repeat EKG and / or multi-disciplinary discussion on risk / benefit of levofloxacin for this patient.   Today, 12/17/2019 Day #4 antibiotics - SCr improving - Blood cx: E. Coli (resistant to amp, amp/sulb only)  - Urine cx with E. Coli, susc pending - Renal U/S with bladder trabeculation with likely outlet obstruction  Plan:  Meropenem 1 g IV q12h  With improvement in renal function, calculated CrCl = 49.6 mL/min which is borderline renal dose adjustment. Normalized CrCl = 50.5 mL/min  Pharmacy to follow peripherally to evaluate need for dose adjustment  Height: 5\' 9"  (175.3 cm) Weight: 72.2 kg (159 lb 2.8 oz) IBW/kg (Calculated) : 70.7  Temp (24hrs), Avg:98.4 F (36.9 C), Min:97.8 F (36.6 C), Max:98.9 F (37.2 C)  Recent Labs  Lab 12/13/19 1722 12/13/19 2235 12/14/19 0305 12/14/19 0404 12/15/19 1004 12/16/19 0855 12/17/19 0517  WBC 8.1  --   --  14.2* 8.4 5.4  --   CREATININE 2.49*  --   --  2.04* 1.60* 1.11 1.07  LATICACIDVEN 2.3* 3.1* 1.4  --   --   --   --     Estimated Creatinine Clearance: 49.6 mL/min (by C-G formula based on SCr of 1.07 mg/dL).    Allergies  Allergen Reactions  . Penicillin G Rash    Antimicrobials this admission: Ceftriaxone  10/25 >>10/28 Meropenem 10/28 >> 10/29, 10/29 >> Cefazolin 10/29 >> 10/29  Thank you for allowing pharmacy to be a part of this patient's care.  11/29 12/17/2019 7:48 PM

## 2019-12-17 NOTE — Progress Notes (Signed)
Pharmacy Antibiotic Note  Jesus Stuart is a 86 y.o. male admitted on 12/13/2019 with E. Coli bacteremia from urinary source.  Pharmacy has been consulted for Cefazolin dosing. Patient on ceftriaxone 10/25 - 10/28, escalated to meropenem for fever x1 10/28. Patient evaluated by ID and changing to cefazolin.   Today, 12/17/2019 Day #4 antibiotic - SCr improving - Blood cx: E. Coli (resistant to amp, amp/sulb only)  - Urine cx with E. Coli, susc pending - Renal U/S with bladder trabeculation with likely outlet obstruction  Plan:  Cefazolin 2gm IV q8h   Pharmacy to follow peripherally to evaluate need for dose adjustment  Height: 5\' 9"  (175.3 cm) Weight: 72.2 kg (159 lb 2.8 oz) IBW/kg (Calculated) : 70.7  Temp (24hrs), Avg:98.3 F (36.8 C), Min:97.8 F (36.6 C), Max:98.9 F (37.2 C)  Recent Labs  Lab 12/13/19 1722 12/13/19 2235 12/14/19 0305 12/14/19 0404 12/15/19 1004 12/16/19 0855 12/17/19 0517  WBC 8.1  --   --  14.2* 8.4 5.4  --   CREATININE 2.49*  --   --  2.04* 1.60* 1.11 1.07  LATICACIDVEN 2.3* 3.1* 1.4  --   --   --   --     Estimated Creatinine Clearance: 49.6 mL/min (by C-G formula based on SCr of 1.07 mg/dL).    Allergies  Allergen Reactions  . Penicillin G Rash    Antimicrobials this admission: Ceftriaxone 10/25 >>10/28 Meropenem 10/28 >> 10/29 Cefazolin 10/29 >>  Thank you for allowing pharmacy to be a part of this patient's care.  11/29, PharmD, BCPS.   Work Cell: [phone] 12/17/2019 12:42 PM

## 2019-12-17 NOTE — Progress Notes (Signed)
PT Cancellation Note  Patient Details Name: Jesus Stuart MRN: [id number] DOB: Apr 27, 1930   Cancelled Treatment:    Reason Eval/Treat Not Completed: Other (comment).  Pt resting in bed with eyes closed and lights off upon PT entering pt's room.  Pt reporting that every time he was almost asleep someone would come in; pt requesting to try to rest and sleep at this time (nurse aware).  Will re-attempt PT treatment session at a later date/time as able.  Hendricks Limes, PT 12/17/19, 9:39 AM

## 2019-12-17 NOTE — Progress Notes (Signed)
PROGRESS NOTE    Jesus Stuart  JSE:[id number] DOB: 05/07/1930 DOA: 12/13/2019 PCP: Kirk Ruths, MD    Brief Narrative:  Jesus Stuart a 86 y.o.malewith medical history significant ofCOPD, hypertension, BPH, coronary artery disease, recent cardiac cath with stent placement about 5 days ago presented to hospital with complaints of dysuria, fever and chills with a temperature of around 102.5 F noted by EMS.  Patient met sepsis criteria so was admitted to hospital.  In the ED since initial temperature was 100.2 F with mild tachycardia and respiratory rate of 30/min.  10/28-febrile this am  tmax 101.4 10/28- afebrile today. HR low. Has not received beta blk.    Consultants:     Procedures:   Antimicrobials:   Rocephin    Subjective: Lying in bed this am. Keeps eyes closed when answering my questions.   Objective: Vitals:   12/16/19 2025 12/17/19 0459 12/17/19 0800 12/17/19 1138  BP: (!) 134/54 (!) 143/65 (!) 145/90 135/78  Pulse: 67 (!) 46 (!) 46 (!) 45  Resp: 20 20 18 20   Temp: 98.9 F (37.2 C) 98.4 F (36.9 C) 98.5 F (36.9 C) 97.8 F (36.6 C)  TempSrc: Oral Oral Oral Oral  SpO2: 96% 96% 96% 97%  Weight:      Height:        Intake/Output Summary (Last 24 hours) at 12/17/2019 1441 Last data filed at 12/17/2019 1200 Gross per 24 hour  Intake 880.54 ml  Output 125 ml  Net 755.54 ml   Filed Weights   12/13/19 1714  Weight: 72.2 kg    Examination: Calm, comfortable Anteriorly cta , no w/r/r Bradycardic, s1/s2, no murmur Soft benign, +bs No edema Aaxox3, grossly intact. Keeps eyes closed    Data Reviewed: I have personally reviewed following labs and imaging studies  CBC: Recent Labs  Lab 12/13/19 1722 12/14/19 0404 12/15/19 1004 12/16/19 0855  WBC 8.1 14.2* 8.4 5.4  NEUTROABS 7.1  --   --   --   HGB 13.2 11.5* 11.1* 10.6*  HCT 37.8* 32.9* 32.8* 30.9*  MCV 93.3 94.0 96.5 93.4  PLT 262 213 207 073   Basic Metabolic  Panel: Recent Labs  Lab 12/13/19 1722 12/14/19 0404 12/15/19 1004 12/16/19 0855 12/17/19 0517  NA 130* 132* 135 133* 134*  K 3.1* 3.1* 3.9 3.5 3.2*  CL 99 102 102 103 104  CO2 18* 22 24 22  21*  GLUCOSE 143* 144* 165* 122* 114*  BUN 32* 27* 26* 17 14  CREATININE 2.49* 2.04* 1.60* 1.11 1.07  CALCIUM 8.2* 7.7* 8.1* 7.6* 7.7*   GFR: Estimated Creatinine Clearance: 49.6 mL/min (by C-G formula based on SCr of 1.07 mg/dL). Liver Function Tests: Recent Labs  Lab 12/13/19 1722 12/14/19 0404  AST 25 25  ALT 17 16  ALKPHOS 66 53  BILITOT 1.7* 1.2  PROT 7.1 5.8*  ALBUMIN 3.6 2.8*   No results for input(s): LIPASE, AMYLASE in the last 168 hours. No results for input(s): AMMONIA in the last 168 hours. Coagulation Profile: Recent Labs  Lab 12/13/19 1722 12/14/19 0404  INR 1.1 1.1   Cardiac Enzymes: No results for input(s): CKTOTAL, CKMB, CKMBINDEX, TROPONINI in the last 168 hours. BNP (last 3 results) No results for input(s): PROBNP in the last 8760 hours. HbA1C: No results for input(s): HGBA1C in the last 72 hours. CBG: No results for input(s): GLUCAP in the last 168 hours. Lipid Profile: No results for input(s): CHOL, HDL, LDLCALC, TRIG, CHOLHDL, LDLDIRECT in the last  72 hours. Thyroid Function Tests: No results for input(s): TSH, T4TOTAL, FREET4, T3FREE, THYROIDAB in the last 72 hours. Anemia Panel: No results for input(s): VITAMINB12, FOLATE, FERRITIN, TIBC, IRON, RETICCTPCT in the last 72 hours. Sepsis Labs: Recent Labs  Lab 12/13/19 1722 12/13/19 2235 12/14/19 0305 12/14/19 0404 12/16/19 0855 12/17/19 0517  PROCALCITON  --   --   --  31.72 6.94 3.90  LATICACIDVEN 2.3* 3.1* 1.4  --   --   --     Recent Results (from the past 240 hour(s))  Culture, blood (Routine x 2)     Status: Abnormal   Collection Time: 12/13/19  5:22 PM   Specimen: BLOOD  Result Value Ref Range Status   Specimen Description   Final    BLOOD BLOOD RIGHT FOREARM Performed at [facility], [address], [place], [place] [address]    Special Requests   Final    BOTTLES DRAWN AEROBIC AND ANAEROBIC Blood Culture adequate volume Performed at [facility], [address], [place], [place] [address]    Culture  Setup Time   Final    GRAM NEGATIVE RODS IN BOTH AEROBIC AND ANAEROBIC BOTTLES CRITICAL RESULT CALLED TO, READ BACK BY AND VERIFIED WITH: ABBY ELLINGTON @ 0451 12/14/19 RH Performed at [facility], [place] [address], [place], [place] [address]    Culture ESCHERICHIA COLI (A)  Final   Report Status 12/16/2019 FINAL  Final   Organism ID, Bacteria ESCHERICHIA COLI  Final      Susceptibility   Escherichia coli - MIC*    AMPICILLIN >=32 RESISTANT Resistant     CEFAZOLIN <=4 SENSITIVE Sensitive     CEFEPIME <=0.12 SENSITIVE Sensitive     CEFTAZIDIME <=1 SENSITIVE Sensitive     CEFTRIAXONE <=0.25 SENSITIVE Sensitive     CIPROFLOXACIN <=0.25 SENSITIVE Sensitive     GENTAMICIN <=1 SENSITIVE Sensitive     IMIPENEM <=0.25 SENSITIVE Sensitive     TRIMETH/SULFA <=20 SENSITIVE Sensitive     AMPICILLIN/SULBACTAM >=32 RESISTANT Resistant     PIP/TAZO <=4 SENSITIVE Sensitive     * ESCHERICHIA COLI  Respiratory Panel by RT PCR (Flu A&B, Covid) - Nasopharyngeal Swab     Status: None   Collection Time: 12/13/19  5:22 PM   Specimen: Nasopharyngeal Swab  Result Value Ref Range Status   SARS Coronavirus 2 by RT PCR NEGATIVE NEGATIVE Final    Comment: (NOTE) SARS-CoV-2 target nucleic acids are NOT DETECTED.  The SARS-CoV-2 RNA is generally detectable in upper respiratoy specimens during the acute phase of infection. The lowest concentration of SARS-CoV-2 viral copies this assay can detect is 131 copies/mL. A negative result does not preclude SARS-Cov-2 infection and should not be used as the sole basis for treatment or other patient management decisions. A negative result may occur with  improper specimen collection/handling, submission of specimen  other than nasopharyngeal swab, presence of viral mutation(s) within the areas targeted by this assay, and inadequate number of viral copies (<131 copies/mL). A negative result must be combined with clinical observations, patient history, and epidemiological information. The expected result is Negative.  Fact Sheet for Patients:  PinkCheek.be  Fact Sheet for Healthcare Providers:  GravelBags.it  This test is no t yet approved or cleared by the [place] FDA and  has been authorized for detection and/or diagnosis of SARS-CoV-2 by FDA under an Emergency Use Authorization (EUA). This EUA will remain  in effect (meaning this test can be used) for the duration  of the COVID-19 declaration under Section 564(b)(1) of the Act, 21 U.S.C. section 360bbb-3(b)(1), unless the authorization is terminated or revoked sooner.     Influenza A by PCR NEGATIVE NEGATIVE Final   Influenza B by PCR NEGATIVE NEGATIVE Final    Comment: (NOTE) The Xpert Xpress SARS-CoV-2/FLU/RSV assay is intended as an aid in  the diagnosis of influenza from Nasopharyngeal swab specimens and  should not be used as a sole basis for treatment. Nasal washings and  aspirates are unacceptable for Xpert Xpress SARS-CoV-2/FLU/RSV  testing.  Fact Sheet for Patients: PinkCheek.be  Fact Sheet for Healthcare Providers: GravelBags.it  This test is not yet approved or cleared by the [place] FDA and  has been authorized for detection and/or diagnosis of SARS-CoV-2 by  FDA under an Emergency Use Authorization (EUA). This EUA will remain  in effect (meaning this test can be used) for the duration of the  Covid-19 declaration under Section 564(b)(1) of the Act, 21  U.S.C. section 360bbb-3(b)(1), unless the authorization is  terminated or revoked. Performed at [facility], [address]  [place], [place] [address]   Blood Culture ID Panel (Reflexed)     Status: Abnormal   Collection Time: 12/13/19  5:22 PM  Result Value Ref Range Status   Enterococcus faecalis NOT DETECTED NOT DETECTED Final   Enterococcus Faecium NOT DETECTED NOT DETECTED Final   Listeria monocytogenes NOT DETECTED NOT DETECTED Final   Staphylococcus species NOT DETECTED NOT DETECTED Final   Staphylococcus aureus (BCID) NOT DETECTED NOT DETECTED Final   Staphylococcus epidermidis NOT DETECTED NOT DETECTED Final   Staphylococcus lugdunensis NOT DETECTED NOT DETECTED Final   Streptococcus species NOT DETECTED NOT DETECTED Final   Streptococcus agalactiae NOT DETECTED NOT DETECTED Final   Streptococcus pneumoniae NOT DETECTED NOT DETECTED Final   Streptococcus pyogenes NOT DETECTED NOT DETECTED Final   A.calcoaceticus-baumannii NOT DETECTED NOT DETECTED Final   Bacteroides fragilis NOT DETECTED NOT DETECTED Final   Enterobacterales DETECTED (A) NOT DETECTED Final    Comment: Enterobacterales represent a large order of gram negative bacteria, not a single organism. CRITICAL RESULT CALLED TO, READ BACK BY AND VERIFIED WITH: ABBY ELLINGTON @ 0451 12/14/19 RH    Enterobacter cloacae complex NOT DETECTED NOT DETECTED Final   Escherichia coli DETECTED (A) NOT DETECTED Final    Comment: CRITICAL RESULT CALLED TO, READ BACK BY AND VERIFIED WITH: ABBY ELLINGTON @ 0451 12/14/19 RH    Klebsiella aerogenes NOT DETECTED NOT DETECTED Final   Klebsiella oxytoca NOT DETECTED NOT DETECTED Final   Klebsiella pneumoniae NOT DETECTED NOT DETECTED Final   Proteus species NOT DETECTED NOT DETECTED Final   Salmonella species NOT DETECTED NOT DETECTED Final   Serratia marcescens NOT DETECTED NOT DETECTED Final   Haemophilus influenzae NOT DETECTED NOT DETECTED Final   Neisseria meningitidis NOT DETECTED NOT DETECTED Final   Pseudomonas aeruginosa NOT DETECTED NOT DETECTED Final   Stenotrophomonas maltophilia NOT DETECTED NOT  DETECTED Final   Candida albicans NOT DETECTED NOT DETECTED Final   Candida auris NOT DETECTED NOT DETECTED Final   Candida glabrata NOT DETECTED NOT DETECTED Final   Candida krusei NOT DETECTED NOT DETECTED Final   Candida parapsilosis NOT DETECTED NOT DETECTED Final   Candida tropicalis NOT DETECTED NOT DETECTED Final   Cryptococcus neoformans/gattii NOT DETECTED NOT DETECTED Final   CTX-M ESBL NOT DETECTED NOT DETECTED Final   Carbapenem resistance IMP NOT DETECTED NOT DETECTED Final   Carbapenem resistance KPC NOT DETECTED NOT DETECTED  Final   Carbapenem resistance NDM NOT DETECTED NOT DETECTED Final   Carbapenem resist OXA 48 LIKE NOT DETECTED NOT DETECTED Final   Carbapenem resistance VIM NOT DETECTED NOT DETECTED Final    Comment: Performed at [facility], [address] [place], [place] [address]  Culture, blood (Routine x 2)     Status: Abnormal   Collection Time: 12/13/19  5:28 PM   Specimen: BLOOD  Result Value Ref Range Status   Specimen Description   Final    BLOOD BLOOD RIGHT FOREARM Performed at [facility], [address], [place], [place] [address]    Special Requests   Final    BOTTLES DRAWN AEROBIC AND ANAEROBIC Blood Culture adequate volume Performed at [facility], [address], [place], [place] [address]    Culture  Setup Time   Final    GRAM NEGATIVE RODS AEROBIC BOTTLE ONLY CRITICAL VALUE NOTED.  VALUE IS CONSISTENT WITH PREVIOUSLY REPORTED AND CALLED VALUE. Performed at [facility], [address] [place], [place] [address]    Culture (A)  Final    ESCHERICHIA COLI SUSCEPTIBILITIES PERFORMED ON PREVIOUS CULTURE WITHIN THE LAST 5 DAYS. Performed at [facility], [place] [address] [place], [place] [address]    Report Status 12/16/2019 FINAL  Final  Urine culture     Status: Abnormal (Preliminary result)   Collection Time: 12/13/19  6:19 PM   Specimen: Urine, Random  Result Value Ref Range Status    Specimen Description   Final    URINE, RANDOM Performed at [facility], [address], [place], [place] [address]    Special Requests   Final    NONE Performed at [facility], [address], [place], [place] [address]    Culture (A)  Final    >=100,000 COLONIES/mL ESCHERICHIA COLI SUSCEPTIBILITIES TO FOLLOW Performed at [facility], [place] [address], [place], [place] [address]    Report Status PENDING  Incomplete         Radiology Studies: No results found.      Scheduled Meds: . aspirin EC  81 mg Oral QHS  . atorvastatin  40 mg Oral Daily  . clopidogrel  75 mg Oral QHS  . enoxaparin (LOVENOX) injection  40 mg Subcutaneous Q24H  . isosorbide mononitrate  30 mg Oral QPM  . melatonin  5 mg Oral QHS  . mometasone-formoterol  2 puff Inhalation BID  . pantoprazole  40 mg Oral QHS  . sodium chloride flush  3 mL Intravenous Q12H  . tamsulosin  0.4 mg Oral QPC supper   Continuous Infusions: .  ceFAZolin (ANCEF) IV 2 g (12/17/19 1329)    Assessment & Plan:   Principal Problem:   Sepsis due to gram-negative UTI ([place]) Active Problems:   COPD (chronic obstructive pulmonary disease) ([facility])   Essential hypertension   Hypokalemia   Hyponatremia   AKI (acute kidney injury) ([place])   E.Coli Urosepsis- with bacteremia- Patient presented with signs of sepsis including leukocytosis, fever, tachypnea, elevated lactate, acute kidney injury.  blood and ucx both + ecoli sensitive to current abx Cytosis resolved Procalcitonin from 31-3.90. Renal ultrasound without hydronephrosis Was febrile yesterday and Rocephin was changed to meropenem ID was consulted-meropenem was switched to cefazolin today Empirically started on Flomax     COPD:Without acute exacerbation  Continue Dulera and inhalers     Hyponatremia: Mild.  Improved with IV fluids  Mild hypokalemia.  Replaced today as K is  3.2 We will check a.m. levels   AKI: Creatinine  on presentation at 2.4.  Likely prerenal 2/2 dehydration/sepsis Continue to hold losartan and HCTZ Improved and stable with IV fluids, creatinine 1.07 today     History of coronary artery disease:Status post recent catheterization 5 days back status post coronary stent.Marland Kitchen  asx Continue with medical regimen Beta-blockers discontinued due to bradycardia, I did discuss with Dr. Ubaldo Glassing about patient following up as outpatient for evaluation of his heart rate    Essential hypertension:   stable Continue to monitor   DVT prophylaxis: Lovenox Code Status: Full Family Communication:none at bedside  Status is: Inpatient  Remains inpatient appropriate because:IV treatments appropriate due to intensity of illness or inability to take PO   Dispo: The patient is from: Home              Anticipated d/c is to: Home              Anticipated d/c date is: 1-2 days              Patient currently is not medically stable to d/c. need iv abx, ID input.            LOS: 4 days   Time spent: 35 minutes with more than 50% on [place], MD [facility] Pager [phone]  If 7PM-7AM, please contact night-coverage [email or web address] Password [email or web address] 12/17/2019, 2:41 PM

## 2019-12-18 DIAGNOSIS — N390 Urinary tract infection, site not specified: Secondary | ICD-10-CM | POA: Diagnosis not present

## 2019-12-18 DIAGNOSIS — A4150 Gram-negative sepsis, unspecified: Secondary | ICD-10-CM | POA: Diagnosis not present

## 2019-12-18 LAB — PROCALCITONIN: Procalcitonin: 1.91 ng/mL

## 2019-12-18 LAB — URINE CULTURE: Culture: >=100000 — AB

## 2019-12-18 LAB — POTASSIUM: Potassium: 3.9 mmol/L (ref 3.5–5.1)

## 2019-12-18 LAB — PSA: Prostatic Specific Antigen: 8.24 ng/mL — ABNORMAL HIGH (ref 0.00–4.00)

## 2019-12-18 MED ORDER — BACID PO TABS: 2.0000 | ORAL_TABLET | Freq: Three times a day (TID) | ORAL | Status: DC

## 2019-12-18 MED ORDER — SULFAMETHOXAZOLE-TRIMETHOPRIM 800-160 MG PO TABS: 1.0000 | ORAL_TABLET | Freq: Two times a day (BID) | ORAL | 0 refills | Status: AC

## 2019-12-18 MED ORDER — SODIUM CHLORIDE 0.9 % IV SOLN
2.0000 g | INTRAVENOUS | Status: DC
  Administered 2019-12-18: 2 g via INTRAVENOUS
  Filled 2019-12-18: qty 20

## 2019-12-18 MED ORDER — TAMSULOSIN HCL 0.4 MG PO CAPS
0.4000 mg | ORAL_CAPSULE | Freq: Every day | ORAL | 1 refills | Status: DC
Start: 2019-12-18 — End: 2023-06-30

## 2019-12-18 MED ORDER — ISOSORBIDE MONONITRATE ER 30 MG PO TB24
30.0000 mg | ORAL_TABLET | Freq: Every evening | ORAL | 1 refills | Status: DC
Start: 2019-12-18 — End: 2023-06-30

## 2019-12-18 NOTE — Discharge Summary (Signed)
Jesus Stuart IPJ:[id number] DOB: Aug 04, 1930 DOA: 12/13/2019  PCP: Lauro Regulus, MD  Admit date: 12/13/2019 Discharge date: 12/18/2019  Admitted From: home Disposition:  home  Recommendations for Outpatient Follow-up:  1. Follow up with PCP in 1 week 2. Please obtain BMP/CBC in one week 3. Follow up with ID  In one week for repeat cx's 4. Follow-up with Dr. Lady Gary next week for evaluation of heart rate  Home Health:PT/Nsg/ aide    Discharge Condition:Stable CODE STATUS: Full Diet recommendation: Heart Healthy / Carb Modified / Regular / Dysphagia  Brief/Interim Summary: Jesus Stuart is a 86 y.o. male with medical history significant of COPD, hypertension, BPH, coronary artery disease, recent cardiac cath with stent placement about 5 days ago who presented with dysuria fever and chills.  Patient also had temperature up to 102.5 at home as recorded by EMS. Urinalysis consistent with UTI.  He also meets sepsis criteria so is being admitted with sepsis secondary to UTI and patient being evaluated and treated as such.. He was admitted to the hospital service for urosepsis and bacteremia.  Started on antibiotics.  Was also found with acute kidney injury.  He was started on IV fluids for resuscitation.  Urine and blood cultures came back with E. coli.    E.Coli Urosepsis- with bacteremia-Patient presented with signs of sepsis including leukocytosis, fever, tachypnea, elevated lactate, acute kidney injury.  blood and ucx both + ecoli sensitive to current abx Leukocytosis resolved Procalcitonin decreased from 31.72....> 1.91 Renal ultrasound without hydronephrosis Was febrile again while on antibiotics, antibiotics were changed, and ID was consulted Empirically was started on Flomax PSA mildly elevated at 8.24, d/w pt and daughter that need to f/u with pcp or urology for further management ID recommended 14 days total antibiotic treatment.  ID recommended Bactrim p.o.  To  follow-up with ID next week for repeat cultures.     COPD:Without acute exacerbation  Continue home meds    Hyponatremia:Mild.  Improved with IV fluids  Mild hypokalemia.  Replace potassium 3.9   BHA:[id number] on presentation at 2.4.  Likely prerenal 2/2 dehydration/sepsis  losartan and HCTZ were discontinued Improved and stable with IV fluids, now creatinine at 1.07.     History ofcoronary artery disease:Status post recent catheterization5 days back status post coronary stent.Marland Kitchen asx Beta-blockers was discontinued due to bradycardia, I did discuss with Dr. Lady Gary about patient following up as outpatient for evaluation of his heart rate.    Essential hypertension: Was low to stable.  Thus his blood pressure medications were not restarted. Will need further management and monitoring as outpatient to see which blood pressure medications will be best for him to resume.  Weakness- PT recommends HH.  Discharge Diagnoses:  Principal Problem:   Sepsis due to gram-negative UTI (HCC) Active Problems:   COPD (chronic obstructive pulmonary disease) (HCC)   Essential hypertension   Hypokalemia   Hyponatremia   AKI (acute kidney injury) A [facility])    Discharge Instructions  Discharge Instructions    Call MD for:  temperature >100.4   Complete by: As directed    Diet - low sodium heart healthy   Complete by: As directed    Discharge instructions   Complete by: As directed    Start antibiotics in am Please hydrate a lot No alcohol   Face-to-face encounter (required for Medicare/Medicaid patients)   Complete by: As directed    I Lynn Ito certify that this patient is under my care and that I, or a nurse  practitioner or physician's assistant working with me, had a face-to-face encounter that meets the physician face-to-face encounter requirements with this patient on 12/18/2019. The encounter with the patient was in whole, or in part for the  following medical condition(s) which is the primary reason for home health care (List medical condition): weakness, sepsis   The encounter with the patient was in whole, or in part, for the following medical condition, which is the primary reason for home health care: weakness   I certify that, based on my findings, the following services are medically necessary home health services:  Physical therapy Nursing     Reason for Medically Necessary Home Health Services: Therapy- Investment banker, operational, Patent examiner   My clinical findings support the need for the above services: Unsafe ambulation due to balance issues   Further, I certify that my clinical findings support that this patient is homebound due to: Unsafe ambulation due to balance issues   Home Health   Complete by: As directed    To provide the following care/treatments:  PT RN Home Health Aide     Increase activity slowly   Complete by: As directed      Allergies as of 12/18/2019      Reactions   Penicillin G Rash      Medication List    STOP taking these medications   losartan-hydrochlorothiazide 100-25 MG tablet Commonly known as: HYZAAR   metoprolol succinate 25 MG 24 hr tablet Commonly known as: TOPROL-XL   spironolactone 25 MG tablet Commonly known as: ALDACTONE     TAKE these medications   albuterol 108 (90 Base) MCG/ACT inhaler Commonly known as: VENTOLIN HFA Inhale 1-2 puffs into the lungs every 6 (six) hours as needed for wheezing or shortness of breath.   aspirin EC 81 MG tablet Take 81 mg by mouth at bedtime. Swallow whole.   atorvastatin 40 MG tablet Commonly known as: LIPITOR Take 1 tablet (40 mg total) by mouth daily.   budesonide-formoterol 160-4.5 MCG/ACT inhaler Commonly known as: SYMBICORT Inhale 2 puffs into the lungs 2 (two) times daily as needed (respiratory issues.).   clopidogrel 75 MG tablet Commonly known as: PLAVIX Take 75 mg by mouth at bedtime.   isosorbide  mononitrate 30 MG 24 hr tablet Commonly known as: IMDUR Take 1 tablet (30 mg total) by mouth every evening. What changed:   medication strength  how much to take   nitroGLYCERIN 0.4 MG SL tablet Commonly known as: NITROSTAT Place 0.4 mg under the tongue every 5 (five) minutes x 3 doses as needed for chest pain.   pantoprazole 40 MG tablet Commonly known as: PROTONIX Take 40 mg by mouth at bedtime.   sulfamethoxazole-trimethoprim 800-160 MG tablet Commonly known as: BACTRIM DS Take 1 tablet by mouth 2 (two) times daily for 8 days.   tamsulosin 0.4 MG Caps capsule Commonly known as: FLOMAX Take 1 capsule (0.4 mg total) by mouth daily after supper.       Follow-up Information    Dalia Heading, MD Follow up in 1 week(s).   Specialty: Cardiology Contact information: [address] [place] [address] [phone]        Lynn Ito, MD Follow up in 1 week(s).   Specialty: Infectious Diseases Contact information: [address] [place] [address] [phone]        Lauro Regulus, MD Follow up in 1 week(s).   Specialty: Internal Medicine Why: hospital f/u and elevated prostate Contact  information: [address] [facility] [place] [address] [phone]              Allergies  Allergen Reactions  . Penicillin G Rash    Consultations:  ID   Procedures/Studies: CARDIAC CATHETERIZATION  Result Date: 12/08/2019  Ramus lesion is 100% stenosed.  Prox Cx lesion is 100% stenosed.  Prox RCA to Mid RCA lesion is 75% stenosed.  Mid RCA to Dist RCA lesion is 95% stenosed.  Ost RCA to Prox RCA lesion is 50% stenosed.  Origin to Prox Graft lesion is 100% stenosed.  1st Mrg lesion is 100% stenosed.  Mid LAD-1 lesion is 100% stenosed.  Mid LAD-2 lesion is 100% stenosed.  A drug-eluting stent was successfully placed using a STENT RESOLUTE ONYX 2.25X30.  Post intervention, there is a 0% residual  stenosis.  A drug-eluting stent was successfully placed using a STENT RESOLUTE ONYX 2.25X18.  Post intervention, there is a 0% residual stenosis.  Moderate to severely depressed left ventricular function between 30 and 35%  Diagnostic cardiac cath Severely depressed left ventricular function inferior akinesis moderate anterior apical hypokinesis ejection fraction of around 30 to 35% Coronaries left main with moderate disease and calcification of around 25 to 50% LAD diffuse disease proximally of 75 mid portion with 100% occluded but it was grafted Ramus 100% proximal Circumflex 100% proximal RCA was large with moderate disease proximally severe disease in the midportion of around 75% tubular distal portion was 95% diffuse with TIMI-3 flow Intervention Successful PCI and stent of mid to distal RCA Lesion 75/95 with overlapping stents resolute Onyx DES reducing lesion down to 0, TIMI-3 flow was maintained Patient was placed on aspirin Plavix Angiomax discontinued at reduced rate for an additional 4 hours Minx appointment was unsuccessful so he was held manually   US RENAL  Result Date: 12/14/2019 CLINICAL DATA:  Sepsis of a urinary source EXAM: RENAL / URINARY TRACT ULTRASOUND COMPLETE COMPARISON:  None. FINDINGS: Right Kidney: Renal measurements: 11.4 x 6.1 x 5.3 cm = volume: 194 mL. Renal cortical echogenicity within normal limits. Anechoic partially exophytic simple appearing cyst measuring 3.0 x 2.5 x 2.7 cm arising from the lower pole right kidney. No obstructive urolithiasis or hydronephrosis. No worrisome renal lesion. Left Kidney: Renal measurements: 11.9 x 5.4 x 4.6 cm = volume: 154 mL. Renal cortical echogenicity within normal limits. Anechoic, partially exophytic simple appearing cyst measuring 4.1 x 2.9 x 4.1 cm arising from the upper pole left kidney. No obstructive urolithiasis or hydronephrosis. No worrisome renal lesion. Bladder: Partially decompressed at the time of examination with a prevoid  volume of 102.08 and a postvoid residual of 95 cc. Right bladder jet is identified. The left bladder jet is not visualized though is nonspecific in the absence of other features of obstruction. Slight bladder trabeculation is noted. Mild wall thickening though possibly secondary to underdistention. Other: Incidental note made of prostatomegaly with the prostate measuring approximately 5 x 4 x 5.7 cm for an estimated volume of 59.73 mL. IMPRESSION: 1. Simple appearing bilateral renal cysts. 2. No obstructive urolithiasis or hydronephrosis. 3. Prostatomegaly. 4. Bladder trabeculation and mild wall thickening may be related to underdistention and/or chronic outlet obstruction though could correlate with sepsis of urinary source. Postvoid residual of 95 CC. Electronically Signed   By: Kreg Shropshire M.D.   On: 12/14/2019 15:53   DG Chest Port 1 View  Result Date: 12/13/2019 CLINICAL DATA:  Sepsis. Additional history provided: Patient presents with fever,  chills, fatigue, dizziness, cardiac stent placed last week. EXAM: PORTABLE CHEST 1 VIEW COMPARISON:  Report from prior chest radiographs 05/03/2002 (images unavailable). FINDINGS: Prior median sternotomy and CABG. Discontinuity of multiple sternal cerclage wires. Heart size at the upper limits of normal. Aortic atherosclerosis. No appreciable airspace consolidation or pulmonary edema. No evidence of pleural effusion or pneumothorax. No acute bony abnormality identified. IMPRESSION: No evidence of acute cardiopulmonary abnormality. Prior median sternotomy/CABG with discontinuity of multiple sternal cerclage wires. Heart size at the upper limits of normal. Aortic Atherosclerosis (ICD10-I70.0). Electronically Signed   By: Jackey Loge DO   On: 12/13/2019 18:23       Subjective: No complaints this AM.  Discharge Exam: Vitals:   12/18/19 0946 12/18/19 1110  BP: 136/60 (!) 135/58  Pulse: (!) 58 (!) 54  Resp: 18 16  Temp: 98.7 F (37.1 C) 97.6 F (36.4 C)   SpO2: 97% 98%   Vitals:   12/17/19 2340 12/18/19 0337 12/18/19 0946 12/18/19 1110  BP: (!) 141/65 (!) 150/58 136/60 (!) 135/58  Pulse: (!) 51 (!) 48 (!) 58 (!) 54  Resp: 20 16 18 16   Temp: 99 F (37.2 C) 98.8 F (37.1 C) 98.7 F (37.1 C) 97.6 F (36.4 C)  TempSrc: Oral Oral Oral Oral  SpO2: 97% 97% 97% 98%  Weight:      Height:        General: Pt is alert, awake, not in acute distress Cardiovascular: RRR, S1/S2 +, no rubs, no gallops Respiratory: CTA bilaterally, no wheezing, no rhonchi Abdominal: Soft, NT, ND, bowel sounds + Extremities: no edema, no cyanosis    The results of significant diagnostics from this hospitalization (including imaging, microbiology, ancillary and laboratory) are listed below for reference.     Microbiology: Recent Results (from the past 240 hour(s))  Culture, blood (Routine x 2)     Status: Abnormal   Collection Time: 12/13/19  5:22 PM   Specimen: BLOOD  Result Value Ref Range Status   Specimen Description   Final    BLOOD BLOOD RIGHT FOREARM Performed at [facility], [address] [place], [place] [address]    Special Requests   Final    BOTTLES DRAWN AEROBIC AND ANAEROBIC Blood Culture adequate volume Performed at [facility], [address], [place], [place] [address]    Culture  Setup Time   Final    GRAM NEGATIVE RODS IN BOTH AEROBIC AND ANAEROBIC BOTTLES CRITICAL RESULT CALLED TO, READ BACK BY AND VERIFIED WITH: ABBY ELLINGTON @ 0451 12/14/19 RH Performed at [facility], [address], [place], [place] [address]    Culture ESCHERICHIA COLI (A)  Final   Report Status 12/16/2019 FINAL  Final   Organism ID, Bacteria ESCHERICHIA COLI  Final      Susceptibility   Escherichia coli - MIC*    AMPICILLIN >=32 RESISTANT Resistant     CEFAZOLIN <=4 SENSITIVE Sensitive     CEFEPIME <=0.12 SENSITIVE Sensitive     CEFTAZIDIME <=1 SENSITIVE Sensitive     CEFTRIAXONE <=0.25 SENSITIVE Sensitive      CIPROFLOXACIN <=0.25 SENSITIVE Sensitive     GENTAMICIN <=1 SENSITIVE Sensitive     IMIPENEM <=0.25 SENSITIVE Sensitive     TRIMETH/SULFA <=20 SENSITIVE Sensitive     AMPICILLIN/SULBACTAM >=32 RESISTANT Resistant     PIP/TAZO <=4 SENSITIVE Sensitive     * ESCHERICHIA COLI  Respiratory Panel by RT PCR (Flu A&B, Covid) - Nasopharyngeal Swab     Status: None  Collection Time: 12/13/19  5:22 PM   Specimen: Nasopharyngeal Swab  Result Value Ref Range Status   SARS Coronavirus 2 by RT PCR NEGATIVE NEGATIVE Final    Comment: (NOTE) SARS-CoV-2 target nucleic acids are NOT DETECTED.  The SARS-CoV-2 RNA is generally detectable in upper respiratoy specimens during the acute phase of infection. The lowest concentration of SARS-CoV-2 viral copies this assay can detect is 131 copies/mL. A negative result does not preclude SARS-Cov-2 infection and should not be used as the sole basis for treatment or other patient management decisions. A negative result may occur with  improper specimen collection/handling, submission of specimen other than nasopharyngeal swab, presence of viral mutation(s) within the areas targeted by this assay, and inadequate number of viral copies (<131 copies/mL). A negative result must be combined with clinical observations, patient history, and epidemiological information. The expected result is Negative.  Fact Sheet for Patients:  https://www.moore.com/  Fact Sheet for Healthcare Providers:  https://www.young.biz/  This test is no t yet approved or cleared by the [place] FDA and  has been authorized for detection and/or diagnosis of SARS-CoV-2 by FDA under an Emergency Use Authorization (EUA). This EUA will remain  in effect (meaning this test can be used) for the duration of the COVID-19 declaration under Section 564(b)(1) of the Act, 21 U.S.C. section 360bbb-3(b)(1), unless the authorization is terminated or revoked  sooner.     Influenza A by PCR NEGATIVE NEGATIVE Final   Influenza B by PCR NEGATIVE NEGATIVE Final    Comment: (NOTE) The Xpert Xpress SARS-CoV-2/FLU/RSV assay is intended as an aid in  the diagnosis of influenza from Nasopharyngeal swab specimens and  should not be used as a sole basis for treatment. Nasal washings and  aspirates are unacceptable for Xpert Xpress SARS-CoV-2/FLU/RSV  testing.  Fact Sheet for Patients: https://www.moore.com/  Fact Sheet for Healthcare Providers: https://www.young.biz/  This test is not yet approved or cleared by the [place] FDA and  has been authorized for detection and/or diagnosis of SARS-CoV-2 by  FDA under an Emergency Use Authorization (EUA). This EUA will remain  in effect (meaning this test can be used) for the duration of the  Covid-19 declaration under Section 564(b)(1) of the Act, 21  U.S.C. section 360bbb-3(b)(1), unless the authorization is  terminated or revoked. Performed at [facility], [address] [place], [place] [address]   Blood Culture ID Panel (Reflexed)     Status: Abnormal   Collection Time: 12/13/19  5:22 PM  Result Value Ref Range Status   Enterococcus faecalis NOT DETECTED NOT DETECTED Final   Enterococcus Faecium NOT DETECTED NOT DETECTED Final   Listeria monocytogenes NOT DETECTED NOT DETECTED Final   Staphylococcus species NOT DETECTED NOT DETECTED Final   Staphylococcus aureus (BCID) NOT DETECTED NOT DETECTED Final   Staphylococcus epidermidis NOT DETECTED NOT DETECTED Final   Staphylococcus lugdunensis NOT DETECTED NOT DETECTED Final   Streptococcus species NOT DETECTED NOT DETECTED Final   Streptococcus agalactiae NOT DETECTED NOT DETECTED Final   Streptococcus pneumoniae NOT DETECTED NOT DETECTED Final   Streptococcus pyogenes NOT DETECTED NOT DETECTED Final   A.calcoaceticus-baumannii NOT DETECTED NOT DETECTED Final   Bacteroides fragilis NOT  DETECTED NOT DETECTED Final   Enterobacterales DETECTED (A) NOT DETECTED Final    Comment: Enterobacterales represent a large order of gram negative bacteria, not a single organism. CRITICAL RESULT CALLED TO, READ BACK BY AND VERIFIED WITH: ABBY ELLINGTON @ 0451 12/14/19 RH    Enterobacter cloacae complex NOT  DETECTED NOT DETECTED Final   Escherichia coli DETECTED (A) NOT DETECTED Final    Comment: CRITICAL RESULT CALLED TO, READ BACK BY AND VERIFIED WITH: ABBY ELLINGTON @ 0451 12/14/19 RH    Klebsiella aerogenes NOT DETECTED NOT DETECTED Final   Klebsiella oxytoca NOT DETECTED NOT DETECTED Final   Klebsiella pneumoniae NOT DETECTED NOT DETECTED Final   Proteus species NOT DETECTED NOT DETECTED Final   Salmonella species NOT DETECTED NOT DETECTED Final   Serratia marcescens NOT DETECTED NOT DETECTED Final   Haemophilus influenzae NOT DETECTED NOT DETECTED Final   Neisseria meningitidis NOT DETECTED NOT DETECTED Final   Pseudomonas aeruginosa NOT DETECTED NOT DETECTED Final   Stenotrophomonas maltophilia NOT DETECTED NOT DETECTED Final   Candida albicans NOT DETECTED NOT DETECTED Final   Candida auris NOT DETECTED NOT DETECTED Final   Candida glabrata NOT DETECTED NOT DETECTED Final   Candida krusei NOT DETECTED NOT DETECTED Final   Candida parapsilosis NOT DETECTED NOT DETECTED Final   Candida tropicalis NOT DETECTED NOT DETECTED Final   Cryptococcus neoformans/gattii NOT DETECTED NOT DETECTED Final   CTX-M ESBL NOT DETECTED NOT DETECTED Final   Carbapenem resistance IMP NOT DETECTED NOT DETECTED Final   Carbapenem resistance KPC NOT DETECTED NOT DETECTED Final   Carbapenem resistance NDM NOT DETECTED NOT DETECTED Final   Carbapenem resist OXA 48 LIKE NOT DETECTED NOT DETECTED Final   Carbapenem resistance VIM NOT DETECTED NOT DETECTED Final    Comment: Performed at [facility], [address] [place], [place] [address]  Culture, blood (Routine x 2)     Status: Abnormal    Collection Time: 12/13/19  5:28 PM   Specimen: BLOOD  Result Value Ref Range Status   Specimen Description   Final    BLOOD BLOOD RIGHT FOREARM Performed at [facility], [address], [place], [place] [address]    Special Requests   Final    BOTTLES DRAWN AEROBIC AND ANAEROBIC Blood Culture adequate volume Performed at [facility], [address] [place], [place] [address]    Culture  Setup Time   Final    GRAM NEGATIVE RODS AEROBIC BOTTLE ONLY CRITICAL VALUE NOTED.  VALUE IS CONSISTENT WITH PREVIOUSLY REPORTED AND CALLED VALUE. Performed at [facility], [address] [place], [place] [address]    Culture (A)  Final    ESCHERICHIA COLI SUSCEPTIBILITIES PERFORMED ON PREVIOUS CULTURE WITHIN THE LAST 5 DAYS. Performed at [facility], [address], [place], [place] [address]    Report Status 12/16/2019 FINAL  Final  Urine culture     Status: Abnormal   Collection Time: 12/13/19  6:19 PM   Specimen: Urine, Random  Result Value Ref Range Status   Specimen Description   Final    URINE, RANDOM Performed at [facility], [address], [place], [place] [address]    Special Requests   Final    NONE Performed at [facility], [address] [place], [place] [address]    Culture >=100,000 COLONIES/mL ESCHERICHIA COLI (A)  Final   Report Status 12/18/2019 FINAL  Final   Organism ID, Bacteria ESCHERICHIA COLI (A)  Final      Susceptibility   Escherichia coli - MIC*    AMPICILLIN >=32 RESISTANT Resistant     CEFAZOLIN <=4 SENSITIVE Sensitive     CEFTRIAXONE <=0.25 SENSITIVE Sensitive     CIPROFLOXACIN <=0.25 SENSITIVE Sensitive     GENTAMICIN <=1 SENSITIVE Sensitive  IMIPENEM <=0.25 SENSITIVE Sensitive     NITROFURANTOIN <=16 SENSITIVE Sensitive     TRIMETH/SULFA <=20 SENSITIVE Sensitive     AMPICILLIN/SULBACTAM >=32 RESISTANT Resistant     PIP/TAZO <=4 SENSITIVE Sensitive     * >=100,000 COLONIES/mL  ESCHERICHIA COLI     Labs: BNP (last 3 results) No results for input(s): BNP in the last 8760 hours. Basic Metabolic Panel: Recent Labs  Lab 12/13/19 1722 12/13/19 1722 12/14/19 0404 12/15/19 1004 12/16/19 0855 12/17/19 0517 12/18/19 0500  NA 130*  --  132* 135 133* 134*  --   K 3.1*   < > 3.1* 3.9 3.5 3.2* 3.9  CL 99  --  102 102 103 104  --   CO2 18*  --  22 24 22  21*  --   GLUCOSE 143*  --  144* 165* 122* 114*  --   BUN 32*  --  27* 26* 17 14  --   CREATININE 2.49*  --  2.04* 1.60* 1.11 1.07  --   CALCIUM 8.2*  --  7.7* 8.1* 7.6* 7.7*  --    < > = values in this interval not displayed.   Liver Function Tests: Recent Labs  Lab 12/13/19 1722 12/14/19 0404  AST 25 25  ALT 17 16  ALKPHOS 66 53  BILITOT 1.7* 1.2  PROT 7.1 5.8*  ALBUMIN 3.6 2.8*   No results for input(s): LIPASE, AMYLASE in the last 168 hours. No results for input(s): AMMONIA in the last 168 hours. CBC: Recent Labs  Lab 12/13/19 1722 12/14/19 0404 12/15/19 1004 12/16/19 0855  WBC 8.1 14.2* 8.4 5.4  NEUTROABS 7.1  --   --   --   HGB 13.2 11.5* 11.1* 10.6*  HCT 37.8* 32.9* 32.8* 30.9*  MCV 93.3 94.0 96.5 93.4  PLT 262 213 207 193   Cardiac Enzymes: No results for input(s): CKTOTAL, CKMB, CKMBINDEX, TROPONINI in the last 168 hours. BNP: Invalid input(s): POCBNP CBG: No results for input(s): GLUCAP in the last 168 hours. D-Dimer No results for input(s): DDIMER in the last 72 hours. Hgb A1c No results for input(s): HGBA1C in the last 72 hours. Lipid Profile No results for input(s): CHOL, HDL, LDLCALC, TRIG, CHOLHDL, LDLDIRECT in the last 72 hours. Thyroid function studies No results for input(s): TSH, T4TOTAL, T3FREE, THYROIDAB in the last 72 hours.  Invalid input(s): FREET3 Anemia work up No results for input(s): VITAMINB12, FOLATE, FERRITIN, TIBC, IRON, RETICCTPCT in the last 72 hours. Urinalysis    Component Value Date/Time   COLORURINE AMBER (A) 12/13/2019 1819   APPEARANCEUR  CLOUDY (A) 12/13/2019 1819   LABSPEC 1.012 12/13/2019 1819   PHURINE 5.0 12/13/2019 1819   GLUCOSEU NEGATIVE 12/13/2019 1819   HGBUR SMALL (A) 12/13/2019 1819   BILIRUBINUR NEGATIVE 12/13/2019 1819   KETONESUR NEGATIVE 12/13/2019 1819   PROTEINUR 100 (A) 12/13/2019 1819   NITRITE NEGATIVE 12/13/2019 1819   LEUKOCYTESUR MODERATE (A) 12/13/2019 1819   Sepsis Labs Invalid input(s): PROCALCITONIN,  WBC,  LACTICIDVEN Microbiology Recent Results (from the past 240 hour(s))  Culture, blood (Routine x 2)     Status: Abnormal   Collection Time: 12/13/19  5:22 PM   Specimen: BLOOD  Result Value Ref Range Status   Specimen Description   Final    BLOOD BLOOD RIGHT FOREARM Performed at [facility], [address] [place], [place] [address]    Special Requests   Final    BOTTLES DRAWN AEROBIC AND ANAEROBIC Blood Culture adequate volume Performed  at [facility], [address] [place], [place] [address]    Culture  Setup Time   Final    GRAM NEGATIVE RODS IN BOTH AEROBIC AND ANAEROBIC BOTTLES CRITICAL RESULT CALLED TO, READ BACK BY AND VERIFIED WITH: Laureen Ochs @ 0451 12/14/19 RH Performed at [facility], [address] [place], [place] [address]    Culture ESCHERICHIA COLI (A)  Final   Report Status 12/16/2019 FINAL  Final   Organism ID, Bacteria ESCHERICHIA COLI  Final      Susceptibility   Escherichia coli - MIC*    AMPICILLIN >=32 RESISTANT Resistant     CEFAZOLIN <=4 SENSITIVE Sensitive     CEFEPIME <=0.12 SENSITIVE Sensitive     CEFTAZIDIME <=1 SENSITIVE Sensitive     CEFTRIAXONE <=0.25 SENSITIVE Sensitive     CIPROFLOXACIN <=0.25 SENSITIVE Sensitive     GENTAMICIN <=1 SENSITIVE Sensitive     IMIPENEM <=0.25 SENSITIVE Sensitive     TRIMETH/SULFA <=20 SENSITIVE Sensitive     AMPICILLIN/SULBACTAM >=32 RESISTANT Resistant     PIP/TAZO <=4 SENSITIVE Sensitive     * ESCHERICHIA COLI  Respiratory Panel by RT PCR (Flu A&B, Covid) - Nasopharyngeal  Swab     Status: None   Collection Time: 12/13/19  5:22 PM   Specimen: Nasopharyngeal Swab  Result Value Ref Range Status   SARS Coronavirus 2 by RT PCR NEGATIVE NEGATIVE Final    Comment: (NOTE) SARS-CoV-2 target nucleic acids are NOT DETECTED.  The SARS-CoV-2 RNA is generally detectable in upper respiratoy specimens during the acute phase of infection. The lowest concentration of SARS-CoV-2 viral copies this assay can detect is 131 copies/mL. A negative result does not preclude SARS-Cov-2 infection and should not be used as the sole basis for treatment or other patient management decisions. A negative result may occur with  improper specimen collection/handling, submission of specimen other than nasopharyngeal swab, presence of viral mutation(s) within the areas targeted by this assay, and inadequate number of viral copies (<131 copies/mL). A negative result must be combined with clinical observations, patient history, and epidemiological information. The expected result is Negative.  Fact Sheet for Patients:  https://www.moore.com/  Fact Sheet for Healthcare Providers:  https://www.young.biz/  This test is no t yet approved or cleared by the [place] FDA and  has been authorized for detection and/or diagnosis of SARS-CoV-2 by FDA under an Emergency Use Authorization (EUA). This EUA will remain  in effect (meaning this test can be used) for the duration of the COVID-19 declaration under Section 564(b)(1) of the Act, 21 U.S.C. section 360bbb-3(b)(1), unless the authorization is terminated or revoked sooner.     Influenza A by PCR NEGATIVE NEGATIVE Final   Influenza B by PCR NEGATIVE NEGATIVE Final    Comment: (NOTE) The Xpert Xpress SARS-CoV-2/FLU/RSV assay is intended as an aid in  the diagnosis of influenza from Nasopharyngeal swab specimens and  should not be used as a sole basis for treatment. Nasal washings and  aspirates are  unacceptable for Xpert Xpress SARS-CoV-2/FLU/RSV  testing.  Fact Sheet for Patients: https://www.moore.com/  Fact Sheet for Healthcare Providers: https://www.young.biz/  This test is not yet approved or cleared by the [place] FDA and  has been authorized for detection and/or diagnosis of SARS-CoV-2 by  FDA under an Emergency Use Authorization (EUA). This EUA will remain  in effect (meaning this test can be used) for the duration of the  Covid-19 declaration under Section 564(b)(1) of the Act, 21  U.S.C.  section 360bbb-3(b)(1), unless the authorization is  terminated or revoked. Performed at [facility], [address] [place], [place] [address]   Blood Culture ID Panel (Reflexed)     Status: Abnormal   Collection Time: 12/13/19  5:22 PM  Result Value Ref Range Status   Enterococcus faecalis NOT DETECTED NOT DETECTED Final   Enterococcus Faecium NOT DETECTED NOT DETECTED Final   Listeria monocytogenes NOT DETECTED NOT DETECTED Final   Staphylococcus species NOT DETECTED NOT DETECTED Final   Staphylococcus aureus (BCID) NOT DETECTED NOT DETECTED Final   Staphylococcus epidermidis NOT DETECTED NOT DETECTED Final   Staphylococcus lugdunensis NOT DETECTED NOT DETECTED Final   Streptococcus species NOT DETECTED NOT DETECTED Final   Streptococcus agalactiae NOT DETECTED NOT DETECTED Final   Streptococcus pneumoniae NOT DETECTED NOT DETECTED Final   Streptococcus pyogenes NOT DETECTED NOT DETECTED Final   A.calcoaceticus-baumannii NOT DETECTED NOT DETECTED Final   Bacteroides fragilis NOT DETECTED NOT DETECTED Final   Enterobacterales DETECTED (A) NOT DETECTED Final    Comment: Enterobacterales represent a large order of gram negative bacteria, not a single organism. CRITICAL RESULT CALLED TO, READ BACK BY AND VERIFIED WITH: ABBY ELLINGTON @ 0451 12/14/19 RH    Enterobacter cloacae complex NOT DETECTED NOT DETECTED Final    Escherichia coli DETECTED (A) NOT DETECTED Final    Comment: CRITICAL RESULT CALLED TO, READ BACK BY AND VERIFIED WITH: ABBY ELLINGTON @ 0451 12/14/19 RH    Klebsiella aerogenes NOT DETECTED NOT DETECTED Final   Klebsiella oxytoca NOT DETECTED NOT DETECTED Final   Klebsiella pneumoniae NOT DETECTED NOT DETECTED Final   Proteus species NOT DETECTED NOT DETECTED Final   Salmonella species NOT DETECTED NOT DETECTED Final   Serratia marcescens NOT DETECTED NOT DETECTED Final   Haemophilus influenzae NOT DETECTED NOT DETECTED Final   Neisseria meningitidis NOT DETECTED NOT DETECTED Final   Pseudomonas aeruginosa NOT DETECTED NOT DETECTED Final   Stenotrophomonas maltophilia NOT DETECTED NOT DETECTED Final   Candida albicans NOT DETECTED NOT DETECTED Final   Candida auris NOT DETECTED NOT DETECTED Final   Candida glabrata NOT DETECTED NOT DETECTED Final   Candida krusei NOT DETECTED NOT DETECTED Final   Candida parapsilosis NOT DETECTED NOT DETECTED Final   Candida tropicalis NOT DETECTED NOT DETECTED Final   Cryptococcus neoformans/gattii NOT DETECTED NOT DETECTED Final   CTX-M ESBL NOT DETECTED NOT DETECTED Final   Carbapenem resistance IMP NOT DETECTED NOT DETECTED Final   Carbapenem resistance KPC NOT DETECTED NOT DETECTED Final   Carbapenem resistance NDM NOT DETECTED NOT DETECTED Final   Carbapenem resist OXA 48 LIKE NOT DETECTED NOT DETECTED Final   Carbapenem resistance VIM NOT DETECTED NOT DETECTED Final    Comment: Performed at [facility], [address] [place], [place] [address]  Culture, blood (Routine x 2)     Status: Abnormal   Collection Time: 12/13/19  5:28 PM   Specimen: BLOOD  Result Value Ref Range Status   Specimen Description   Final    BLOOD BLOOD RIGHT FOREARM Performed at [facility], [address] [place], [place] [address]    Special Requests   Final    BOTTLES DRAWN AEROBIC AND ANAEROBIC Blood Culture adequate volume Performed at  [facility], [address] [place], [place] [address]    Culture  Setup Time   Final    GRAM NEGATIVE RODS AEROBIC BOTTLE ONLY CRITICAL VALUE NOTED.  VALUE IS CONSISTENT WITH PREVIOUSLY REPORTED AND CALLED VALUE.  Performed at [facility], [address] [place], [place] [address]    Culture (A)  Final    ESCHERICHIA COLI SUSCEPTIBILITIES PERFORMED ON PREVIOUS CULTURE WITHIN THE LAST 5 DAYS. Performed at [facility], [address], [place], [place] [address]    Report Status 12/16/2019 FINAL  Final  Urine culture     Status: Abnormal   Collection Time: 12/13/19  6:19 PM   Specimen: Urine, Random  Result Value Ref Range Status   Specimen Description   Final    URINE, RANDOM Performed at [facility], [address], [place], [place] [address]    Special Requests   Final    NONE Performed at [facility], [address] [place], [place] [address]    Culture >=100,000 COLONIES/mL ESCHERICHIA COLI (A)  Final   Report Status 12/18/2019 FINAL  Final   Organism ID, Bacteria ESCHERICHIA COLI (A)  Final      Susceptibility   Escherichia coli - MIC*    AMPICILLIN >=32 RESISTANT Resistant     CEFAZOLIN <=4 SENSITIVE Sensitive     CEFTRIAXONE <=0.25 SENSITIVE Sensitive     CIPROFLOXACIN <=0.25 SENSITIVE Sensitive     GENTAMICIN <=1 SENSITIVE Sensitive     IMIPENEM <=0.25 SENSITIVE Sensitive     NITROFURANTOIN <=16 SENSITIVE Sensitive     TRIMETH/SULFA <=20 SENSITIVE Sensitive     AMPICILLIN/SULBACTAM >=32 RESISTANT Resistant     PIP/TAZO <=4 SENSITIVE Sensitive     * >=100,000 COLONIES/mL ESCHERICHIA COLI     Time coordinating discharge: Over 30 minutes  SIGNED:   Lynn Ito, MD  [facility] 12/18/2019, 3:32 PM Pager   If 7PM-7AM, please contact night-coverage [email or web address] Password [email or web address]

## 2019-12-18 NOTE — Plan of Care (Signed)
Patient discharging to home with self care, states doesn't need anything for home. Discharge instructions and medication changes reviewed as well as appointment.  Patient states will make his appointments on Monday.  Patient verbalized understanding. Awaiting family transport.

## 2021-05-10 IMAGING — DX DG CHEST 1V PORT
1 series · 1 of 1 positions shown · non-contrast
Comparison: Report from prior chest radiographs 05/03/2002 (images
unavailable).

CLINICAL DATA: Sepsis. Additional history provided: Patient
presents with fever, chills, fatigue, dizziness, cardiac stent
placed last week.

EXAM:
PORTABLE CHEST 1 VIEW

[chest ap]
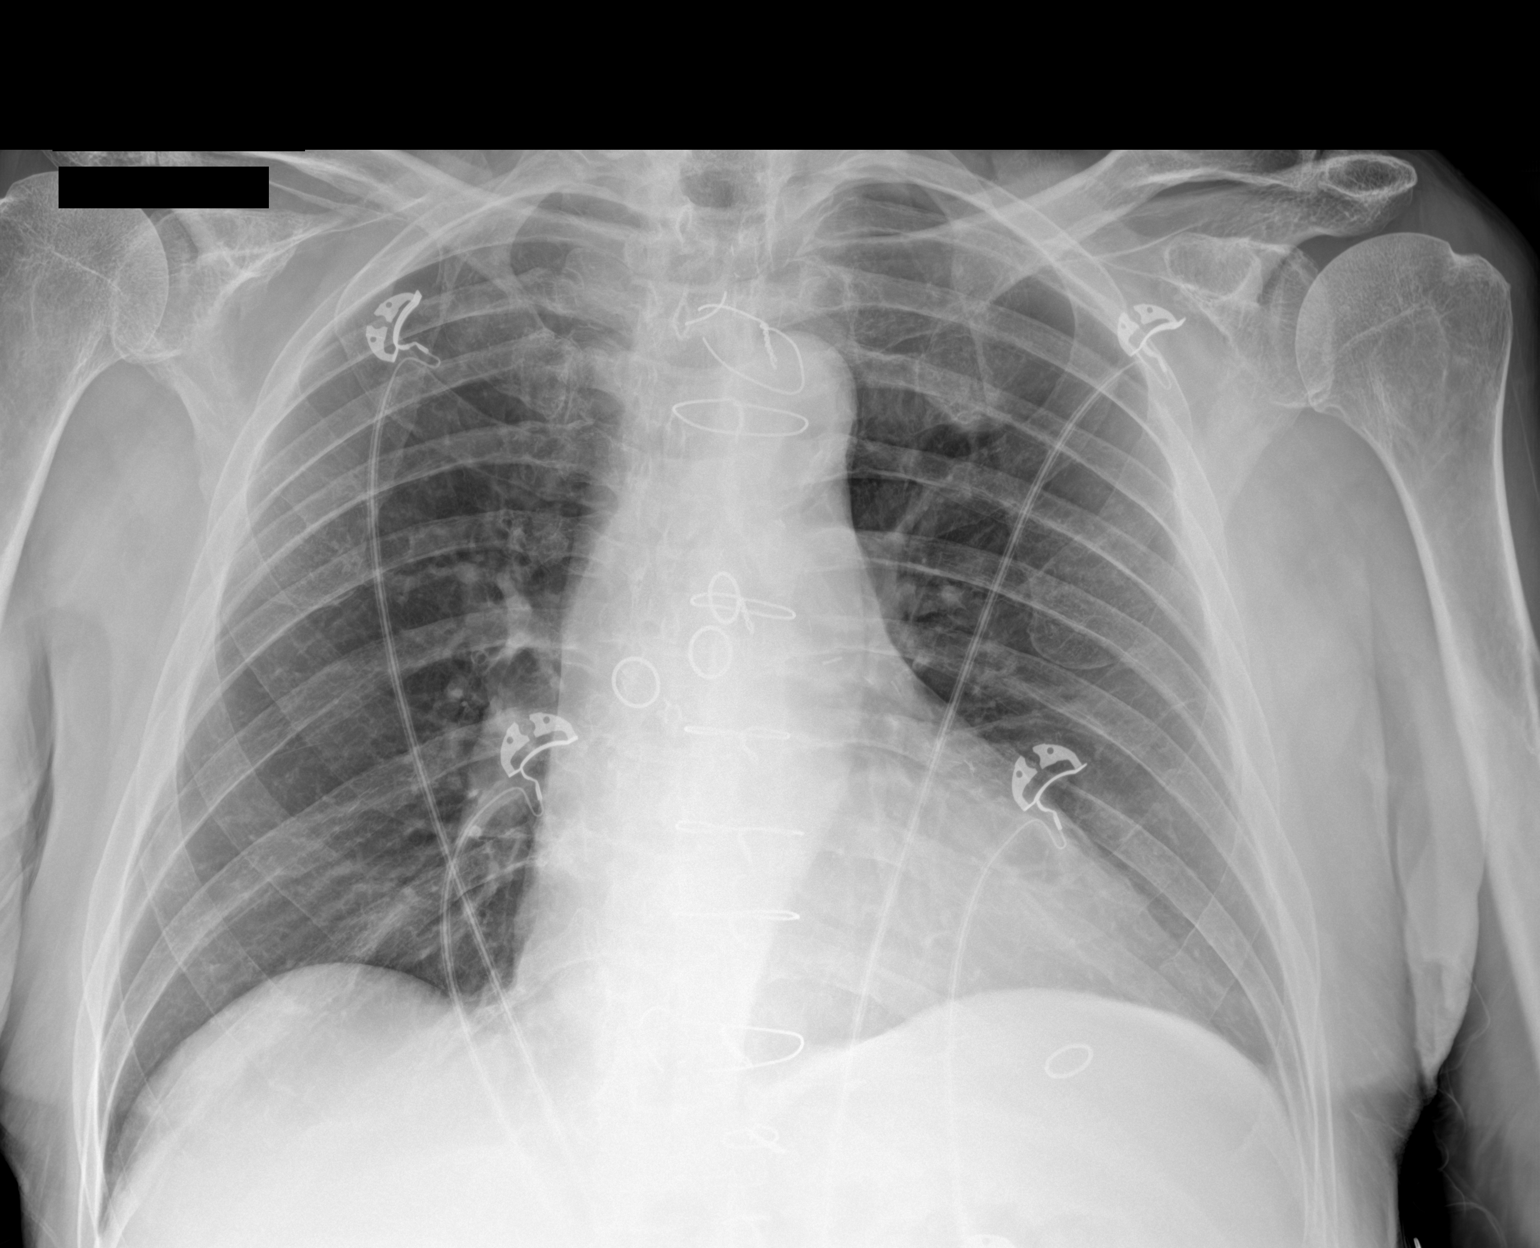

[1 of 1 positions shown; findings below may reference images not displayed]

FINDINGS: Prior median sternotomy and CABG. Discontinuity of multiple sternal
cerclage wires. Heart size at the upper limits of normal. Aortic
atherosclerosis. No appreciable airspace consolidation or pulmonary
edema. No evidence of pleural effusion or pneumothorax. No acute
bony abnormality identified.
IMPRESSION: No evidence of acute cardiopulmonary abnormality.

Prior median sternotomy/CABG with discontinuity of multiple sternal
cerclage wires. Heart size at the upper limits of normal.

Aortic Atherosclerosis ([id number]).

## 2021-05-11 IMAGING — US US RENAL
1 series · 13 of 25 positions shown · non-contrast
Comparison: None.

CLINICAL DATA: Sepsis of a urinary source

EXAM:
RENAL / URINARY TRACT ULTRASOUND COMPLETE

[Series 1: us renal · 13 of 50 slices shown]
[im 1/50]
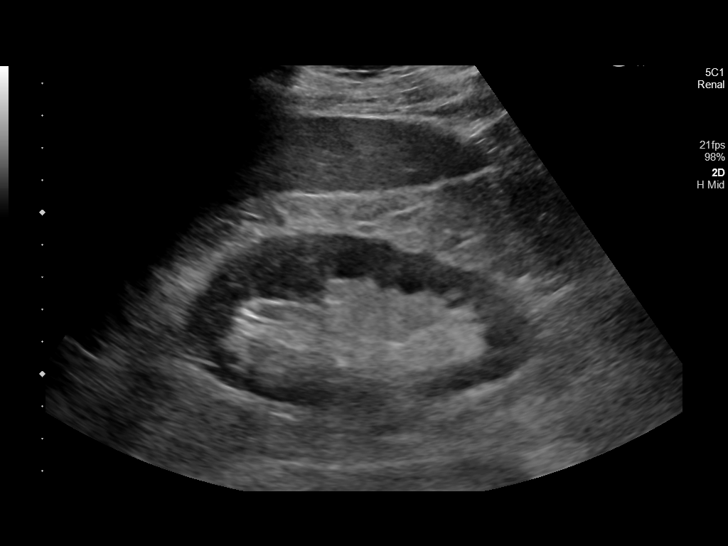
[im 5/50]
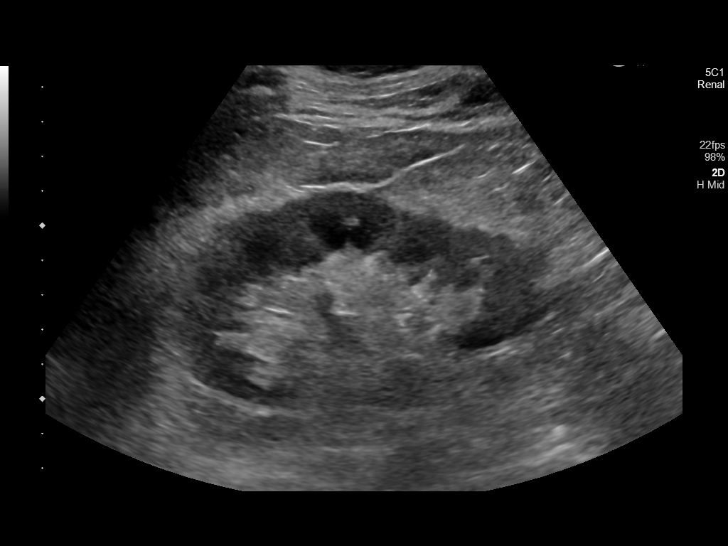
[im 9/50]
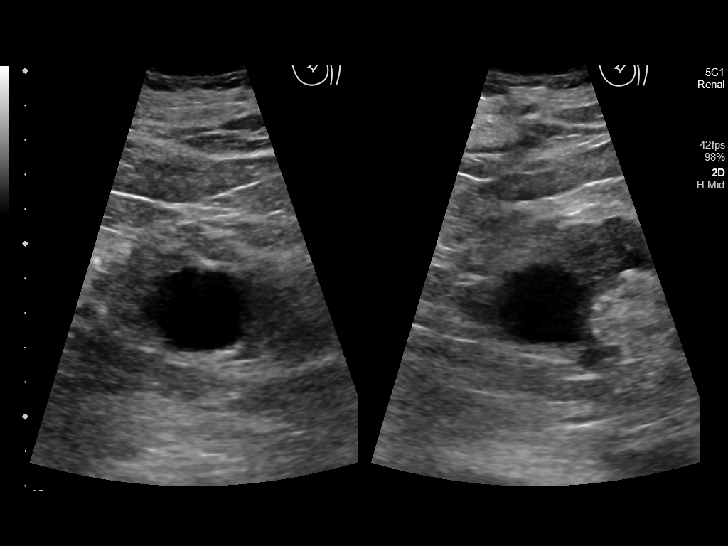
[im 13/50]
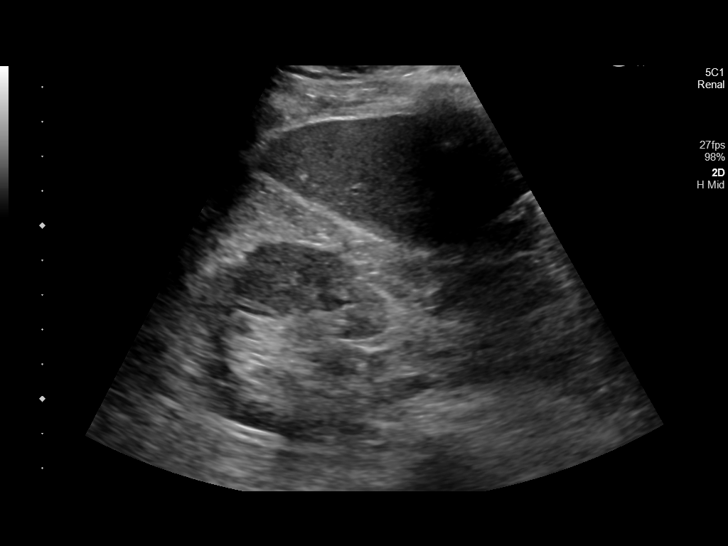
[im 17/50]
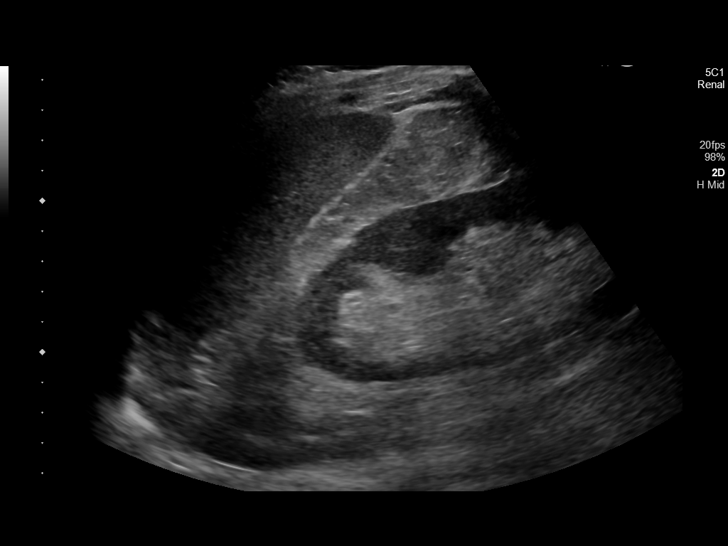
[im 21/50]
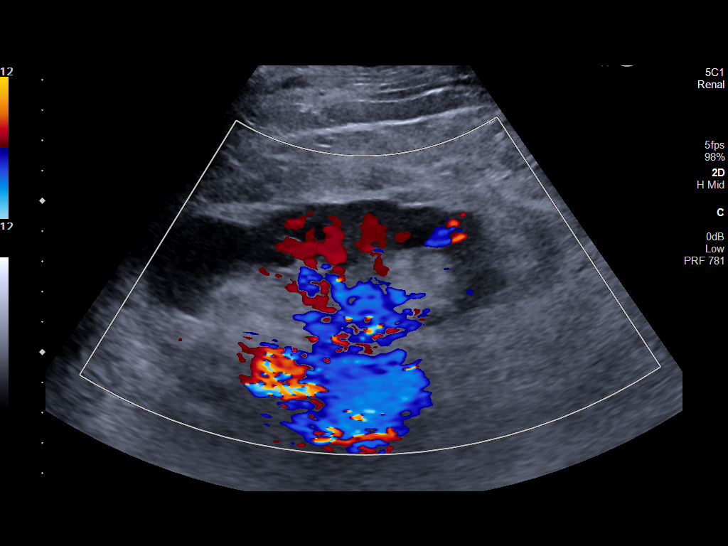
[im 25/50]
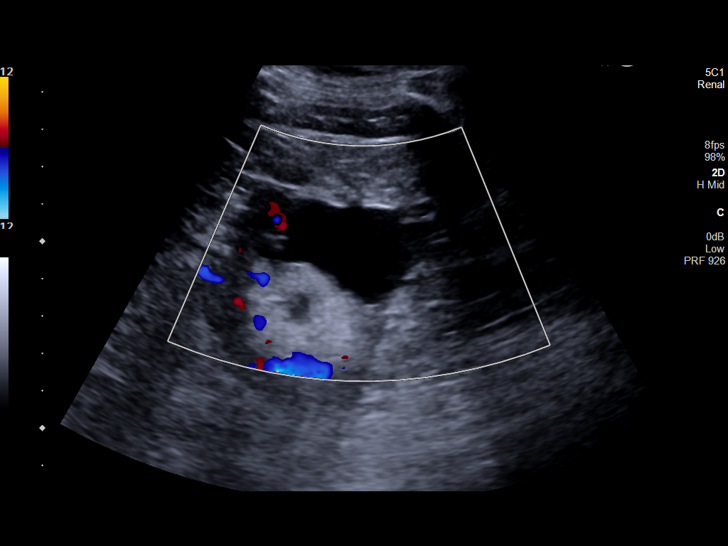
[im 29/50]
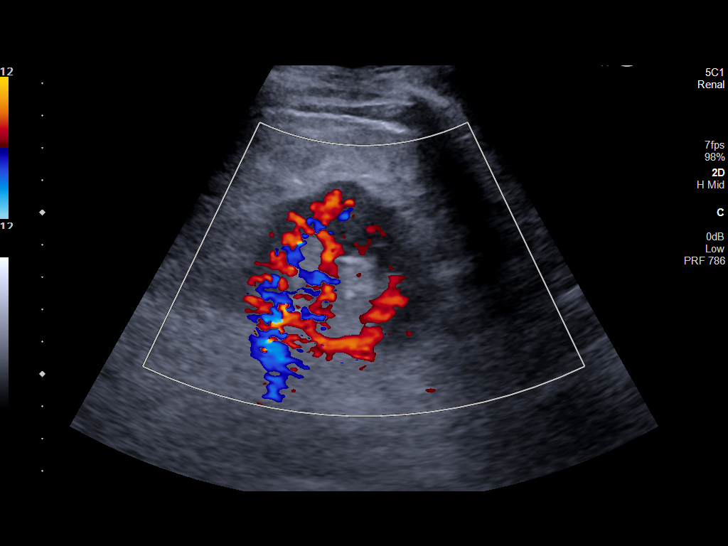
[im 33/50]
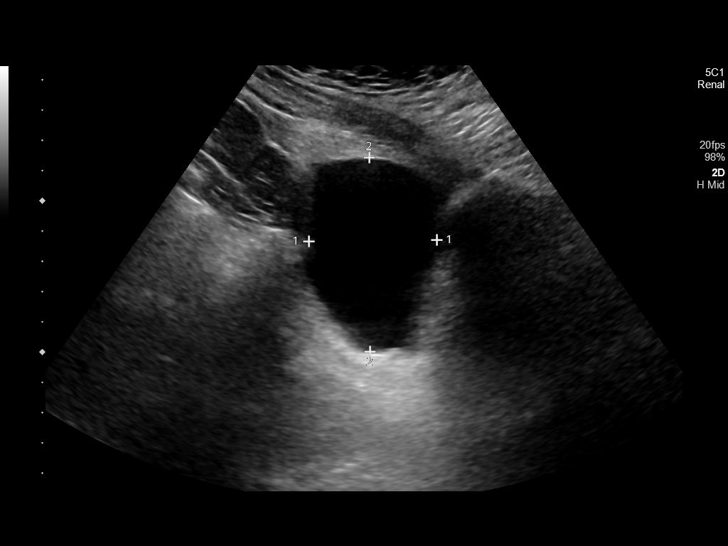
[im 37/50]
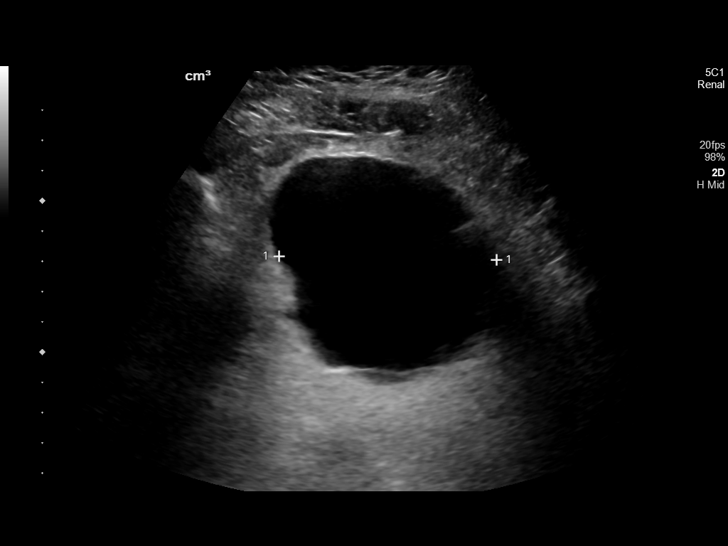
[im 41/50]
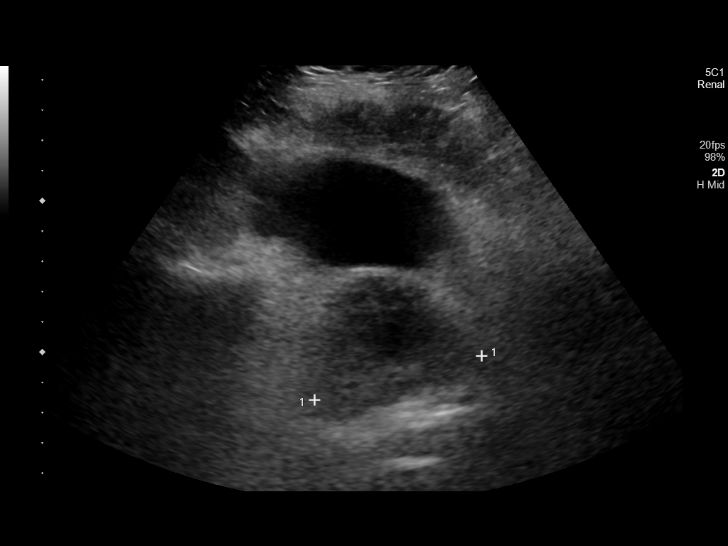
[im 45/50]
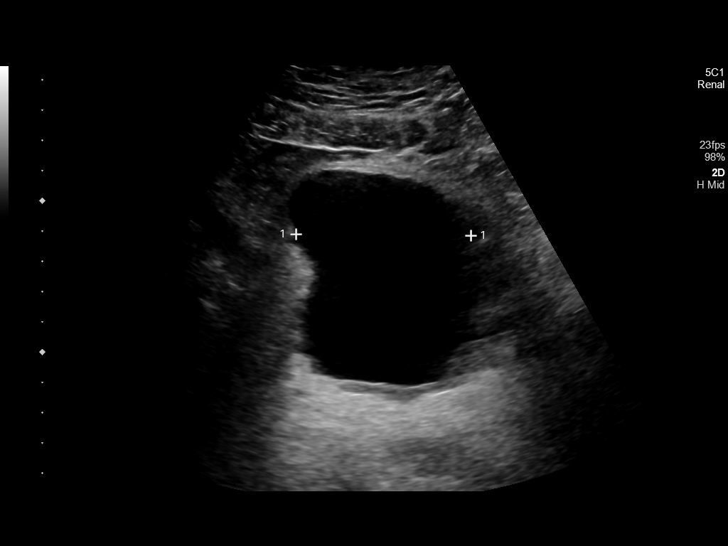
[im 50/50]
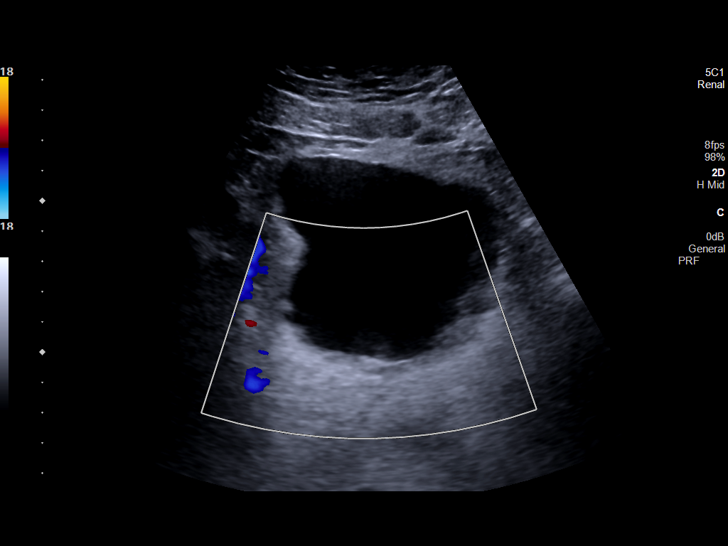

[13 of 25 positions shown; findings below may reference images not displayed]

FINDINGS: Right Kidney:

Renal measurements: 11.4 x 6.1 x 5.3 cm = volume: 194 mL. Renal
cortical echogenicity within normal limits. Anechoic partially
exophytic simple appearing cyst measuring 3.0 x 2.5 x 2.7 cm arising
from the lower pole right kidney. No obstructive urolithiasis or
hydronephrosis. No worrisome renal lesion.

Left Kidney:

Renal measurements: 11.9 x 5.4 x 4.6 cm = volume: 154 mL. Renal
cortical echogenicity within normal limits. Anechoic, partially
exophytic simple appearing cyst measuring 4.1 x 2.9 x 4.1 cm arising
from the upper pole left kidney. No obstructive urolithiasis or
hydronephrosis. No worrisome renal lesion.

Bladder:

Partially decompressed at the time of examination with a prevoid
volume of 102.08 and a postvoid residual of 95 cc. Right bladder jet
is identified. The left bladder jet is not visualized though is
nonspecific in the absence of other features of obstruction. Slight
bladder trabeculation is noted. Mild wall thickening though possibly
secondary to underdistention.

Other:

Incidental note made of prostatomegaly with the prostate measuring
approximately 5 x 4 x 5.7 cm for an estimated volume of 59.73 mL.
IMPRESSION: 1. Simple appearing bilateral renal cysts.
2. No obstructive urolithiasis or hydronephrosis.
3. Prostatomegaly.
4. Bladder trabeculation and mild wall thickening may be related to
underdistention and/or chronic outlet obstruction though could
correlate with sepsis of urinary source. Postvoid residual of 95 CC.

## 2023-03-22 DIAGNOSIS — I4891 Unspecified atrial fibrillation: Secondary | ICD-10-CM

## 2023-03-22 HISTORY — DX: Unspecified atrial fibrillation: I48.91

## 2023-04-16 ENCOUNTER — Other Ambulatory Visit: Payer: Self-pay

## 2023-04-16 ENCOUNTER — Emergency Department: Payer: Medicare Other

## 2023-04-16 ENCOUNTER — Encounter
Admission: EM | Disposition: A | Discharge status: Home Health | Payer: Self-pay | Source: Home / Self Care | Attending: Internal Medicine

## 2023-04-16 ENCOUNTER — Inpatient Hospital Stay
Admit: 2023-04-16 | Discharge: 2023-04-16 | Disposition: A | Discharge status: Home / Self Care | Payer: Medicare Other | Attending: Family Medicine | Admitting: Family Medicine

## 2023-04-16 ENCOUNTER — Inpatient Hospital Stay
Admission: EM | Admit: 2023-04-16 | Discharge: 2023-04-22 | DRG: 280 | Disposition: A | Discharge status: Home Health | Payer: Medicare Other | Attending: Internal Medicine | Admitting: Internal Medicine

## 2023-04-16 ENCOUNTER — Inpatient Hospital Stay: Payer: Medicare Other

## 2023-04-16 DIAGNOSIS — J9601 Acute respiratory failure with hypoxia: Secondary | ICD-10-CM | POA: Diagnosis present

## 2023-04-16 DIAGNOSIS — Z955 Presence of coronary angioplasty implant and graft: Secondary | ICD-10-CM

## 2023-04-16 DIAGNOSIS — M79672 Pain in left foot: Secondary | ICD-10-CM | POA: Diagnosis not present

## 2023-04-16 DIAGNOSIS — I4821 Permanent atrial fibrillation: Secondary | ICD-10-CM | POA: Diagnosis not present

## 2023-04-16 DIAGNOSIS — I10 Essential (primary) hypertension: Secondary | ICD-10-CM | POA: Diagnosis present

## 2023-04-16 DIAGNOSIS — I5023 Acute on chronic systolic (congestive) heart failure: Secondary | ICD-10-CM | POA: Diagnosis present

## 2023-04-16 DIAGNOSIS — Z7901 Long term (current) use of anticoagulants: Secondary | ICD-10-CM

## 2023-04-16 DIAGNOSIS — S51011A Laceration without foreign body of right elbow, initial encounter: Secondary | ICD-10-CM | POA: Diagnosis present

## 2023-04-16 DIAGNOSIS — J449 Chronic obstructive pulmonary disease, unspecified: Secondary | ICD-10-CM | POA: Diagnosis present

## 2023-04-16 DIAGNOSIS — J439 Emphysema, unspecified: Secondary | ICD-10-CM | POA: Diagnosis not present

## 2023-04-16 DIAGNOSIS — I255 Ischemic cardiomyopathy: Secondary | ICD-10-CM | POA: Diagnosis present

## 2023-04-16 DIAGNOSIS — S51019A Laceration without foreign body of unspecified elbow, initial encounter: Secondary | ICD-10-CM | POA: Diagnosis not present

## 2023-04-16 DIAGNOSIS — I5020 Unspecified systolic (congestive) heart failure: Secondary | ICD-10-CM

## 2023-04-16 DIAGNOSIS — I4891 Unspecified atrial fibrillation: Secondary | ICD-10-CM

## 2023-04-16 DIAGNOSIS — Z87891 Personal history of nicotine dependence: Secondary | ICD-10-CM

## 2023-04-16 DIAGNOSIS — I252 Old myocardial infarction: Secondary | ICD-10-CM

## 2023-04-16 DIAGNOSIS — Z7982 Long term (current) use of aspirin: Secondary | ICD-10-CM | POA: Diagnosis not present

## 2023-04-16 DIAGNOSIS — Z880 Allergy status to penicillin: Secondary | ICD-10-CM | POA: Diagnosis not present

## 2023-04-16 DIAGNOSIS — Z7951 Long term (current) use of inhaled steroids: Secondary | ICD-10-CM

## 2023-04-16 DIAGNOSIS — J440 Chronic obstructive pulmonary disease with acute lower respiratory infection: Secondary | ICD-10-CM | POA: Diagnosis not present

## 2023-04-16 DIAGNOSIS — I5022 Chronic systolic (congestive) heart failure: Secondary | ICD-10-CM | POA: Diagnosis not present

## 2023-04-16 DIAGNOSIS — I200 Unstable angina: Secondary | ICD-10-CM | POA: Diagnosis not present

## 2023-04-16 DIAGNOSIS — I214 Non-ST elevation (NSTEMI) myocardial infarction: Principal | ICD-10-CM | POA: Diagnosis present

## 2023-04-16 DIAGNOSIS — Z79899 Other long term (current) drug therapy: Secondary | ICD-10-CM | POA: Diagnosis not present

## 2023-04-16 DIAGNOSIS — I25810 Atherosclerosis of coronary artery bypass graft(s) without angina pectoris: Secondary | ICD-10-CM | POA: Diagnosis present

## 2023-04-16 DIAGNOSIS — N400 Enlarged prostate without lower urinary tract symptoms: Secondary | ICD-10-CM | POA: Diagnosis present

## 2023-04-16 DIAGNOSIS — I482 Chronic atrial fibrillation, unspecified: Secondary | ICD-10-CM | POA: Diagnosis not present

## 2023-04-16 DIAGNOSIS — J181 Lobar pneumonia, unspecified organism: Secondary | ICD-10-CM | POA: Diagnosis not present

## 2023-04-16 DIAGNOSIS — E785 Hyperlipidemia, unspecified: Secondary | ICD-10-CM | POA: Diagnosis present

## 2023-04-16 DIAGNOSIS — N179 Acute kidney failure, unspecified: Secondary | ICD-10-CM | POA: Diagnosis present

## 2023-04-16 DIAGNOSIS — W19XXXA Unspecified fall, initial encounter: Secondary | ICD-10-CM | POA: Diagnosis present

## 2023-04-16 DIAGNOSIS — R079 Chest pain, unspecified: Secondary | ICD-10-CM | POA: Diagnosis not present

## 2023-04-16 DIAGNOSIS — Z823 Family history of stroke: Secondary | ICD-10-CM

## 2023-04-16 DIAGNOSIS — Z7902 Long term (current) use of antithrombotics/antiplatelets: Secondary | ICD-10-CM

## 2023-04-16 DIAGNOSIS — Z66 Do not resuscitate: Secondary | ICD-10-CM | POA: Diagnosis present

## 2023-04-16 DIAGNOSIS — R55 Syncope and collapse: Secondary | ICD-10-CM | POA: Insufficient documentation

## 2023-04-16 DIAGNOSIS — I420 Dilated cardiomyopathy: Secondary | ICD-10-CM | POA: Diagnosis present

## 2023-04-16 DIAGNOSIS — N1831 Chronic kidney disease, stage 3a: Secondary | ICD-10-CM | POA: Diagnosis present

## 2023-04-16 DIAGNOSIS — R9431 Abnormal electrocardiogram [ECG] [EKG]: Secondary | ICD-10-CM

## 2023-04-16 DIAGNOSIS — Y929 Unspecified place or not applicable: Secondary | ICD-10-CM

## 2023-04-16 DIAGNOSIS — I4819 Other persistent atrial fibrillation: Secondary | ICD-10-CM | POA: Diagnosis present

## 2023-04-16 DIAGNOSIS — M79671 Pain in right foot: Secondary | ICD-10-CM | POA: Diagnosis not present

## 2023-04-16 DIAGNOSIS — N189 Chronic kidney disease, unspecified: Secondary | ICD-10-CM | POA: Diagnosis not present

## 2023-04-16 DIAGNOSIS — I2510 Atherosclerotic heart disease of native coronary artery without angina pectoris: Secondary | ICD-10-CM | POA: Diagnosis present

## 2023-04-16 DIAGNOSIS — I130 Hypertensive heart and chronic kidney disease with heart failure and stage 1 through stage 4 chronic kidney disease, or unspecified chronic kidney disease: Secondary | ICD-10-CM | POA: Diagnosis present

## 2023-04-16 HISTORY — DX: Atherosclerotic heart disease of native coronary artery without angina pectoris: I25.10

## 2023-04-16 HISTORY — DX: Acute myocardial infarction, unspecified: I21.9

## 2023-04-16 HISTORY — DX: Unspecified systolic (congestive) heart failure: I50.20

## 2023-04-16 LAB — APTT
aPTT: 32 s (ref 24–36)
aPTT: >200 s (ref 24–36)
aPTT: >200 s (ref 24–36)

## 2023-04-16 LAB — URINALYSIS, ROUTINE W REFLEX MICROSCOPIC
Bilirubin Urine: NEGATIVE
Glucose, UA: NEGATIVE mg/dL
Hgb urine dipstick: NEGATIVE
Ketones, ur: NEGATIVE mg/dL
Leukocytes,Ua: NEGATIVE
Nitrite: NEGATIVE
Protein, ur: 30 mg/dL — AB
Specific Gravity, Urine: 1.018 (ref 1.005–1.030)
pH: 5 (ref 5.0–8.0)

## 2023-04-16 LAB — CBC WITH DIFFERENTIAL/PLATELET
Abs Immature Granulocytes: 0.05 10*3/uL (ref 0.00–0.07)
Basophils Absolute: 0 10*3/uL (ref 0.0–0.1)
Basophils Relative: 0 %
Eosinophils Absolute: 0 10*3/uL (ref 0.0–0.5)
Eosinophils Relative: 0 %
HCT: 38.2 % — ABNORMAL LOW (ref 39.0–52.0)
Hemoglobin: 12.7 g/dL — ABNORMAL LOW (ref 13.0–17.0)
Immature Granulocytes: 1 %
Lymphocytes Relative: 4 %
Lymphs Abs: 0.4 10*3/uL — ABNORMAL LOW (ref 0.7–4.0)
MCH: 32.3 pg (ref 26.0–34.0)
MCHC: 33.2 g/dL (ref 30.0–36.0)
MCV: 97.2 fL (ref 80.0–100.0)
Monocytes Absolute: 0.6 10*3/uL (ref 0.1–1.0)
Monocytes Relative: 6 %
Neutro Abs: 9.1 10*3/uL — ABNORMAL HIGH (ref 1.7–7.7)
Neutrophils Relative %: 89 %
Platelets: 201 10*3/uL (ref 150–400)
RBC: 3.93 MIL/uL — ABNORMAL LOW (ref 4.22–5.81)
RDW: 14.9 % (ref 11.5–15.5)
WBC: 10.1 10*3/uL (ref 4.0–10.5)
nRBC: 0 % (ref 0.0–0.2)

## 2023-04-16 LAB — LIPID PANEL
Cholesterol: 111 mg/dL (ref 0–200)
HDL: 47 mg/dL (ref >40)
LDL Cholesterol: 49 mg/dL (ref 0–99)
Total CHOL/HDL Ratio: 2.4 {ratio}
Triglycerides: 74 mg/dL (ref <150)
VLDL: 15 mg/dL (ref 0–40)

## 2023-04-16 LAB — BRAIN NATRIURETIC PEPTIDE: B Natriuretic Peptide: 3152.8 pg/mL — ABNORMAL HIGH (ref 0.0–100.0)

## 2023-04-16 LAB — COMPREHENSIVE METABOLIC PANEL
ALT: 19 U/L (ref 0–44)
AST: 32 U/L (ref 15–41)
Albumin: 3.6 g/dL (ref 3.5–5.0)
Alkaline Phosphatase: 50 U/L (ref 38–126)
Anion gap: 13 (ref 5–15)
BUN: 21 mg/dL (ref 8–23)
CO2: 20 mmol/L — ABNORMAL LOW (ref 22–32)
Calcium: 8.4 mg/dL — ABNORMAL LOW (ref 8.9–10.3)
Chloride: 102 mmol/L (ref 98–111)
Creatinine, Ser: 1.72 mg/dL — ABNORMAL HIGH (ref 0.61–1.24)
GFR, Estimated: 37 mL/min — ABNORMAL LOW (ref >60)
Glucose, Bld: 273 mg/dL — ABNORMAL HIGH (ref 70–99)
Potassium: 3.9 mmol/L (ref 3.5–5.1)
Sodium: 135 mmol/L (ref 135–145)
Total Bilirubin: 2.4 mg/dL — ABNORMAL HIGH (ref 0.0–1.2)
Total Protein: 6.1 g/dL — ABNORMAL LOW (ref 6.5–8.1)

## 2023-04-16 LAB — HEPARIN LEVEL (UNFRACTIONATED): Heparin Unfractionated: >1.1 [IU]/mL — ABNORMAL HIGH (ref 0.30–0.70)

## 2023-04-16 LAB — TROPONIN I (HIGH SENSITIVITY)
Troponin I (High Sensitivity): 259 ng/L (ref <18)
Troponin I (High Sensitivity): 4685 ng/L (ref <18)
Troponin I (High Sensitivity): >24000 ng/L (ref <18)

## 2023-04-16 LAB — PROTIME-INR
INR: 1.8 — ABNORMAL HIGH (ref 0.8–1.2)
Prothrombin Time: 21.5 s — ABNORMAL HIGH (ref 11.4–15.2)

## 2023-04-16 LAB — LACTIC ACID, PLASMA
Lactic Acid, Venous: 5 mmol/L (ref 0.5–1.9)
Lactic Acid, Venous: 2.2 mmol/L (ref 0.5–1.9)

## 2023-04-16 SURGERY — CORONARY/GRAFT ACUTE MI REVASCULARIZATION
Anesthesia: Moderate Sedation

## 2023-04-16 MED ORDER — SODIUM CHLORIDE 0.9 % IV SOLN: 250.0000 mL | INTRAVENOUS | Status: AC | PRN

## 2023-04-16 MED ORDER — NITROGLYCERIN 0.4 MG SL SUBL: 0.4000 mg | SUBLINGUAL_TABLET | SUBLINGUAL | Status: DC | PRN

## 2023-04-16 MED ORDER — SODIUM CHLORIDE 0.9 % IV SOLN
INTRAVENOUS | Status: DC
  Administered 2023-04-16: 20 mL via INTRAVENOUS

## 2023-04-16 MED ORDER — ONDANSETRON HCL 4 MG/2ML IJ SOLN: 4.0000 mg | Freq: Four times a day (QID) | INTRAMUSCULAR | Status: DC | PRN

## 2023-04-16 MED ORDER — SODIUM CHLORIDE 0.9 % IV BOLUS
500.0000 mL | Freq: Once | INTRAVENOUS | Status: AC
Start: 2023-04-16 — End: 2023-04-16
  Administered 2023-04-16: 500 mL via INTRAVENOUS

## 2023-04-16 MED ORDER — MELATONIN 5 MG PO TABS
5.0000 mg | ORAL_TABLET | Freq: Once | ORAL | Status: AC
  Administered 2023-04-16: 5 mg via ORAL
  Filled 2023-04-16: qty 1

## 2023-04-16 MED ORDER — IPRATROPIUM-ALBUTEROL 0.5-2.5 (3) MG/3ML IN SOLN
3.0000 mL | RESPIRATORY_TRACT | Status: DC | PRN
  Filled 2023-04-16: qty 3

## 2023-04-16 MED ORDER — ASPIRIN 300 MG RE SUPP: 300.0000 mg | RECTAL | Status: DC

## 2023-04-16 MED ORDER — ASPIRIN 81 MG PO TBEC
81.0000 mg | DELAYED_RELEASE_TABLET | Freq: Every day | ORAL | Status: DC
  Administered 2023-04-18 – 2023-04-22 (×4): 81 mg via ORAL
  Filled 2023-04-16 (×5): qty 1

## 2023-04-16 MED ORDER — SODIUM CHLORIDE 0.9 % IV BOLUS
1000.0000 mL | Freq: Once | INTRAVENOUS | Status: AC
  Administered 2023-04-16: 1000 mL via INTRAVENOUS

## 2023-04-16 MED ORDER — HEPARIN BOLUS VIA INFUSION
4000.0000 [IU] | Freq: Once | INTRAVENOUS | Status: AC
  Administered 2023-04-16: 4000 [IU] via INTRAVENOUS
  Filled 2023-04-16: qty 4000

## 2023-04-16 MED ORDER — LACTATED RINGERS IV SOLN
INTRAVENOUS | Status: DC
  Administered 2023-04-16: 75 mL via INTRAVENOUS

## 2023-04-16 MED ORDER — ACETAMINOPHEN 325 MG PO TABS
650.0000 mg | ORAL_TABLET | ORAL | Status: DC | PRN
  Administered 2023-04-19 – 2023-04-22 (×5): 650 mg via ORAL
  Filled 2023-04-16 (×5): qty 2

## 2023-04-16 MED ORDER — SODIUM CHLORIDE 0.9 % IV SOLN: INTRAVENOUS | Status: AC

## 2023-04-16 MED ORDER — HEPARIN (PORCINE) 25000 UT/250ML-% IV SOLN
900.0000 [IU]/h | INTRAVENOUS | Status: DC
  Administered 2023-04-16: 900 [IU]/h via INTRAVENOUS
  Filled 2023-04-16: qty 250

## 2023-04-16 MED ORDER — ATORVASTATIN CALCIUM 20 MG PO TABS
40.0000 mg | ORAL_TABLET | Freq: Every day | ORAL | Status: DC
  Administered 2023-04-16 – 2023-04-22 (×7): 40 mg via ORAL
  Filled 2023-04-16 (×8): qty 2

## 2023-04-16 MED ORDER — PERFLUTREN LIPID MICROSPHERE
1.0000 mL | INTRAVENOUS | Status: AC | PRN
  Administered 2023-04-16: 8 mL via INTRAVENOUS

## 2023-04-16 MED ORDER — SODIUM CHLORIDE 0.9% FLUSH
3.0000 mL | INTRAVENOUS | Status: DC | PRN
  Administered 2023-04-20: 3 mL via INTRAVENOUS

## 2023-04-16 MED ORDER — ASPIRIN 81 MG PO CHEW: 324.0000 mg | CHEWABLE_TABLET | ORAL | Status: DC

## 2023-04-16 MED ORDER — ASPIRIN 81 MG PO CHEW
81.0000 mg | CHEWABLE_TABLET | ORAL | Status: AC
  Administered 2023-04-17: 81 mg via ORAL
  Filled 2023-04-16: qty 1

## 2023-04-16 MED ORDER — HEPARIN (PORCINE) 25000 UT/250ML-% IV SOLN
1000.0000 [IU]/h | INTRAVENOUS | Status: DC
  Administered 2023-04-16 – 2023-04-17 (×2): 700 [IU]/h via INTRAVENOUS
  Filled 2023-04-16: qty 250

## 2023-04-16 NOTE — Progress Notes (Signed)
 PHARMACY - ANTICOAGULATION CONSULT NOTE  Pharmacy Consult for Heparin  Indication: chest pain/ACS  Allergies  Allergen Reactions   Penicillin G Rash    Patient Measurements: Height: 5\' 9"  (175.3 cm) Weight: 73.1 kg (161 lb 2.5 oz) IBW/kg (Calculated) : 70.7 Heparin Dosing Weight: 73.1 kg   Vital Signs: Temp: 97.3 F (36.3 C) (02/26 0413) Temp Source: Oral (02/26 0413) BP: 97/77 (02/26 0600) Pulse Rate: 80 (02/26 0600)  Labs: Recent Labs    04/16/23 0421  HGB 12.7*  HCT 38.2*  PLT 201  APTT 32  LABPROT 21.5*  INR 1.8*  CREATININE 1.72*  TROPONINIHS 259*    Estimated Creatinine Clearance: 28.5 mL/min (A) (by C-G formula based on SCr of 1.72 mg/dL (H)).   Medical History: Past Medical History:  Diagnosis Date   Atrial fibrillation (HCC) 03/2023   BPH (benign prostatic hyperplasia)    COPD (chronic obstructive pulmonary disease) (HCC)    Coronary artery disease    HFrEF (heart failure with reduced ejection fraction) (HCC)    LVEF 25-30% by echo in 2022   Hypertension    MI (myocardial infarction) (HCC)    Reactive airway disease     Medications:  (Not in a hospital admission)   Assessment: Pharmacy consulted to dose heparin in this 89 year old male admitted with ACS/NSTEMI. No prior anticoag noted. CrCl = 28.5 ml/min  Goal of Therapy:  Heparin level 0.3-0.7 units/ml Monitor platelets by anticoagulation protocol: Yes   Plan:  Give 4000 units bolus x 1 Start heparin infusion at 900 units/hr Check anti-Xa level in 8 hours and daily while on heparin Continue to monitor H&H and platelets  Gerard Bonus D 04/16/2023,6:12 AM

## 2023-04-16 NOTE — ED Notes (Signed)
 CRITICAL VALUE STICKER  CRITICAL VALUE: Troponin >24,000  RECEIVER (on-site recipient of call): Miyeko Mahlum  DATE & TIME NOTIFIED: 04/16/23 12:28pm  MESSENGER (representative from lab): lab tech  MD NOTIFIED: Attempted paging TRN x 2, notified EDP K. Paduchowski verbal and consulting physicians via secure chat  TIME OF NOTIFICATION: 12:59  RESPONSE:  awaiting orders

## 2023-04-16 NOTE — Consult Note (Signed)
 Interventional Cardiology Consultation   [name] MRN: [id number]; DOB: 09/22/1930  Admit date: 04/16/2023 Date of Consult: 04/16/2023  PCP:  Jesus Regulus, MD   [facility] Providers Cardiologist:  Jesus Stuart     Patient Profile:   Jesus Stuart is a 89 y.o. male with a hx of coronary artery disease status post remote CABG (LIMA-LAD, SVG-OM1, SVG-OM2, and SVG-PDA) and subsequent PCI to the RCA in 2021, recently diagnosed atrial fibrillation, chronic HFrEF with LVEF 25-30%, hypertension, hyperlipidemia, and COPD, who is being seen 04/16/2023 for the evaluation of chest pain and abnormal EKG at the request of Dr. Modesto Stuart.  History of Present Illness:   Jesus Stuart reports sudden onset of chest pain this evening while in bed.  He describes it as a severe pressure in the center of his chest without associated symptoms such as shortness of breath, nausea, and diaphoresis.  He became weak and fell while trying to get his phone to call help but did not pass out.  He also does not believe that he struck his head.  He summoned EMS with paramedic EKG showing atrial fibrillation with slow ventricular response, RBBB, frequent PVCs, and inferior ST elevation.  Jesus Stuart was recently started on apixaban for his newly diagnosed atrial fibrillation earlier this month, having taken his last dose yesterday at 32 PM.   Past Medical History:  Diagnosis Date   Atrial fibrillation (HCC) 03/2023   BPH (benign prostatic hyperplasia)    COPD (chronic obstructive pulmonary disease) (HCC)    Coronary artery disease    HFrEF (heart failure with reduced ejection fraction) (HCC)    LVEF 25-30% by echo in 2022   Hypertension    MI (myocardial infarction) (HCC)    Reactive airway disease     Past Surgical History:  Procedure Laterality Date   CORONARY ARTERY BYPASS GRAFT     CORONARY STENT INTERVENTION N/A 12/08/2019   Procedure: CORONARY STENT INTERVENTION;  Surgeon:  Jesus Pea, MD;  Location: [facility] INVASIVE CV LAB;  Service: Cardiovascular;  Laterality: N/A;   LEFT HEART CATH AND CORONARY ANGIOGRAPHY Left 12/08/2019   Procedure: LEFT HEART CATH AND CORONARY ANGIOGRAPHY;  Surgeon: Jesus Pea, MD;  Location: [facility] INVASIVE CV LAB;  Service: Cardiovascular;  Laterality: Left;     Inpatient Medications: Scheduled Meds:  Continuous Infusions:  sodium chloride     PRN Meds:   Allergies:    Allergies  Allergen Reactions   Penicillin G Rash    Social History:   Social History   Tobacco Use   Smoking status: Former   Smokeless tobacco: Never   Tobacco comments:    quit 35yrs ago  Vaping Use   Vaping status: Never Used  Substance Use Topics   Alcohol use: Never   Drug use: Never      Family History:   Family History  Problem Relation Age of Onset   Stroke Mother      ROS:  Please see the history of present illness. All other ROS reviewed and negative.     Physical Exam/Data:   Vitals:   04/16/23 0410 04/16/23 0413 04/16/23 0425 04/16/23 0430  BP:  108/65 117/63 122/65  Pulse:  85 66 80  Resp:  (!) 24 (!) 29 (!) 27  Temp:  (!) 97.3 F (36.3 C)    TempSrc:  Oral    SpO2:  97% 100% 100%  Weight: 73.1 kg     Height: 5\' 9"  (1.753  m)      No intake or output data in the 24 hours ending 04/16/23 0452    04/16/2023    4:10 AM 12/13/2019    5:14 PM 12/09/2019    4:58 AM  Last 3 Weights  Weight (lbs) 161 lb 2.5 oz 159 lb 2.8 oz 159 lb 2.8 oz  Weight (kg) 73.1 kg 72.2 kg 72.2 kg     Body mass index is 23.8 kg/m.  General: Elderly man, lying on stretcher in the ED. HEENT: normal Neck: no JVD Vascular: 2+ radial pulses bilaterally. Cardiac: Irregularly irregular rhythm with distant heart sounds.  1/6 systolic murmur noted. Lungs: Mild diminished breath sounds throughout without wheezes or crackles. Abd: soft, nontender, no hepatomegaly  Ext: no edema Musculoskeletal:  No deformities, BUE and BLE strength  normal and equal Skin: Warm and dry.  Right arm abrasions noted. Neuro:  CNs 2-12 intact, no focal abnormalities noted Psych:  Normal affect   EKG:  The EKG was personally reviewed and demonstrates: Atrial fibrillation with right bundle branch block and PVCs. Telemetry:  Telemetry was personally reviewed and demonstrates: Atrial fibrillation with PVCs and brief episodes of NSVT.  Relevant CV Studies: Pharmacologic MPI (04/02/2023, Jesus Stuart): Abnormal study with large area of anterior, septal, and inferior scar.  No significant ischemia observed.  LVEF 21%.  High risk study.  TTE (01/03/2021, Jesus Stuart): LVEF 25-30%.  Mild RV dysfunction.  Moderate mitral and aortic regurgitation.  Laboratory Data:  High Sensitivity Troponin:  No results for input(s): "TROPONINIHS" in the last 720 hours.   ChemistryNo results for input(s): "NA", "K", "CL", "CO2", "GLUCOSE", "BUN", "CREATININE", "CALCIUM", "MG", "GFRNONAA", "GFRAA", "ANIONGAP" in the last 168 hours.  No results for input(s): "PROT", "ALBUMIN", "AST", "ALT", "ALKPHOS", "BILITOT" in the last 168 hours. Lipids No results for input(s): "CHOL", "TRIG", "HDL", "LABVLDL", "LDLCALC", "CHOLHDL" in the last 168 hours.  HematologyNo results for input(s): "WBC", "RBC", "HGB", "HCT", "MCV", "MCH", "MCHC", "RDW", "PLT" in the last 168 hours. Thyroid No results for input(s): "TSH", "FREET4" in the last 168 hours.  BNPNo results for input(s): "BNP", "PROBNP" in the last 168 hours.  DDimer No results for input(s): "DDIMER" in the last 168 hours.   Radiology/Studies:  No results found.   Assessment and Plan:   Chest pain and abnormal EKG: Jesus Stuart presents with sudden onset chest pain this morning with initial EKGs by paramedics demonstrating inferior ST elevation.  His EKGs here do not meet STEMI criteria and show findings of chronic RBBB with atrial fibrillation and PVCs.  His chest pain has improved since EMS was contacted.  As his EKG  does not meet STEMI criteria, I think it is best to pursue further evaluation in the ED and manage his chest pain medically rather than proceed with emergent cardiac catheterization.  Given his therapeutic anticoagulation, advanced age, known multivessel CAD, and severely reduced LVEF, he has high risk for complications of emergent catheterization.  I think it is reasonable to initiate heparin infusion as well as administer aspirin (if not done already).  Serial troponins should be checked.  Consider judicious use of nitrates +/- morphine to help with pain control.  Chronic HFrEF: Jesus Stuart does not appear grossly volume overloaded and does not endorse any recent heart failure symptoms.  Assuming his labs are at baseline, I recommend continuation of his home regimen of goal-directed medical therapy.  Recommend repeating echocardiogram.  Persistent atrial fibrillation: This was recently diagnosed, prompting initiation of apixaban and lieu of aspirin and  clopidogrel.  Heart rate appears reasonably well-controlled at this time.  Recommend holding apixaban and initiating heparin in the setting of suspected ACS.  Continue metoprolol for rate control.  Fall: Patient reports falling and scraping his right arm this morning while trying to get his phone.  He denies hitting his head.  Further workup per ED team.  As the patient follows with Dr. Juliann Pares, please contact [facility] for further evaluation and management this admission.   Risk Assessment/Risk Scores:     TIMI Risk Score for Unstable Angina or Non-ST Elevation MI:   The patient's TIMI risk score is 3, which indicates a 13% risk of all cause mortality, new or recurrent myocardial infarction or need for urgent revascularization in the next 14 days.  New York Heart Association (NYHA) Functional Class NYHA Class II  CHA2DS2-VASc Score = 5   This indicates a 7.2% annual risk of stroke. The patient's score is based upon: CHF  History: 1 HTN History: 1 Diabetes History: 0 Stroke History: 0 Vascular Disease History: 1 Age Score: 2 Gender Score: 0     For questions or updates, please contact [facility] Please consult [email or web address] for contact info under [facility].  Signed, Yvonne Kendall, MD  04/16/2023 4:52 AM

## 2023-04-16 NOTE — ED Notes (Signed)
 Attempted to page [facility] service at [phone] to inform provider of pt critical troponin.

## 2023-04-16 NOTE — ED Notes (Signed)
 Increased oxygen to 4 L [place].

## 2023-04-16 NOTE — Assessment & Plan Note (Addendum)
 Persistent in nature.  Cardiology placed back on Eliquis low-dose. -Holding Eliquis from 3/1 PM for cath on 3/3 as recommended by cardiology

## 2023-04-16 NOTE — Assessment & Plan Note (Addendum)
 Troponin greater than 24,000.  Continue aspirin, low-dose Toprol and Lipitor.  LDL 49.  Patient was on heparin drip for 2 days. -Cardiac cath today with occluded SVG to RCA and 99% stenosis of SVG to small caliber OM2. Intervention deferred given age of graft and concern of renal function.  They are recommending medical management. -Continue current management

## 2023-04-16 NOTE — Progress Notes (Signed)
 OT Cancellation Note  Patient Details Name: Jesus Stuart MRN: [id number] DOB: 23-Mar-1930   Cancelled Treatment:    Reason Eval/Treat Not Completed: Medical issues which prohibited therapy. Order received, chart reviewed. Pt noted to be pending cardiac cath next date and troponin continuing to trend up (>24,000). Will hold and initiate services as pt medically appropriate.   Kathie Dike, M.S. OTR/L  04/16/23, 1:00 PM  ascom [phone]

## 2023-04-16 NOTE — ED Provider Notes (Signed)
 [facility] Provider Note    Event Date/Time   First MD Initiated Contact with Patient 04/16/23 [phone]     (approximate)   History   Code STEMI   HPI  Jesus Stuart is a 89 y.o. male   Past medical history of atrial fibrillation on Eliquis, COPD, CAD with stenting for MIs in the past, heart failure with reduced ejection fraction, hypertension, presents emergency department with code STEMI from the field.  ST elevations in the inferior leads noted by EMS after being called out for chest pain at rest.  Patient notes that he has had exertional chest pain over the last several days and then severe chest pain across the middle of his chest while resting tonight.  He had a syncopal episode right before EMS arrived and fell onto his elbows, unsure of complete loss of consciousness, unsure if he hit his head.  He denies any shortness of breath.  He denies any respiratory infectious symptoms.  He has been compliant with all medications including his Eliquis for which she had last took a dose at around 9 PM.  Independent Historian contributed to assessment above: EMS gives report as above    Physical Exam   Triage Vital Signs: ED Triage Vitals  Encounter Vitals Group     BP --      Systolic BP Percentile --      Diastolic BP Percentile --      Pulse --      Resp --      Temp --      Temp src --      SpO2 --      Weight 04/16/23 0410 161 lb 2.5 oz (73.1 kg)     Height 04/16/23 0410 5\' 9"  (1.753 m)     Head Circumference --      Peak Flow --      Pain Score 04/16/23 0408 6     Pain Loc --      Pain Education --      Exclude from Growth Chart --     Most recent vital signs: Vitals:   04/16/23 0425 04/16/23 0430  BP: 117/63 122/65  Pulse: 66 80  Resp: (!) 29 (!) 27  Temp:    SpO2: 100% 100%    General: Awake, no distress.  CV:  Good peripheral perfusion.  Resp:  Normal effort.  Abd:  No distention. Other:  No acute distress speaking full  sentences awake alert oriented pleasant gentleman laying in the stretcher.  Radial pulses are intact and equal bilaterally appears euvolemic overall.  Soft benign abdominal exam.  No wheezing or focality to lung exam bilaterally. There is no obvious trauma aside from skin tear to the elbow.  His neck is supple with full range of motion no obvious signs of external head trauma.  Chest wall abdomen are nontender to palpation.  He is able to range all extremities.  ED Results / Procedures / Treatments   Labs (all labs ordered are listed, but only abnormal results are displayed) Labs Reviewed  CBC WITH DIFFERENTIAL/PLATELET - Abnormal; Notable for the following components:      Result Value   RBC 3.93 (*)    Hemoglobin 12.7 (*)    HCT 38.2 (*)    Neutro Abs 9.1 (*)    Lymphs Abs 0.4 (*)    All other components within normal limits  PROTIME-INR - Abnormal; Notable for the following components:   Prothrombin Time 21.5 (*)  INR 1.8 (*)    All other components within normal limits  APTT  HEMOGLOBIN A1C  COMPREHENSIVE METABOLIC PANEL  LIPID PANEL  LACTIC ACID, PLASMA  BRAIN NATRIURETIC PEPTIDE  TROPONIN I (HIGH SENSITIVITY)     I ordered and reviewed the above labs they are notable for cell counts unremarkable.  EKG  ED ECG REPORT I, Pilar Jarvis, the attending physician, personally viewed and interpreted this ECG.   Date: 04/16/2023  EKG Time: 0412  Rate: 89  Rhythm: AF  Intervals:rbbb  ST&T Change:  no stemi    RADIOLOGY I independently reviewed and interpreted CT of the head and see no obvious bleeding or midline shift I also reviewed radiologist's formal read.   PROCEDURES:  Critical Care performed: Yes, see critical care procedure note(s)  .Critical Care  Performed by: Pilar Jarvis, MD Authorized by: Pilar Jarvis, MD   Critical care provider statement:    Critical care time (minutes):  30   Critical care was time spent personally by me on the following  activities:  Development of treatment plan with patient or surrogate, discussions with consultants, evaluation of patient's response to treatment, examination of patient, ordering and review of laboratory studies, ordering and review of radiographic studies, ordering and performing treatments and interventions, pulse oximetry, re-evaluation of patient's condition and review of old charts    MEDICATIONS ORDERED IN ED: Medications  0.9 %  sodium chloride infusion (has no administration in time range)  sodium chloride 0.9 % bolus 500 mL (500 mLs Intravenous New Bag/Given 04/16/23 0427)    External physician / consultants:  I spoke with Dr. Cristal Deer End of STEMI cardiology regarding care plan for this patient.   IMPRESSION / MDM / ASSESSMENT AND PLAN / ED COURSE  I reviewed the triage vital signs and the nursing notes.                                Patient's presentation is most consistent with acute presentation with potential threat to life or bodily function.  Differential diagnosis includes, but is not limited to, ACS, unstable angina, respiratory infection, musculoskeletal pain, dysrhythmia, electrolyte disturbance   The patient is on the cardiac monitor to evaluate for evidence of arrhythmia and/or significant heart rate changes.  MDM:    Code STEMI for the field that was canceled by Dr. Thayer Ohm and upon evaluation in the emergency department given does not meet STEMI criteria on EKG which shows pre-existing right bundle branch block and no obvious ischemic changes.  Patient does have symptoms consistent with unstable angina and that he has had exertional chest pain over the last several days and now chest pain at rest.  Was given aspirin by EMS and pain has substantially improved though still lingers around a 4 out of 10.  He has been fully compliant with Eliquis I doubt PE.  I doubt respiratory infection given no infectious symptoms.   Dr. Okey Dupre and agrees with heparin for  unstable angina. Since he had a fall and he takes Eliquis unknown head strike I think prudent to check a CT of the head rule out ICH prior to starting heparin infusion for unstable angina.  Ultimately given concern for unstable angina in this 89 year old with extensive cardiac history I think he will need to be admitted.       FINAL CLINICAL IMPRESSION(S) / ED DIAGNOSES   Final diagnoses:  Unstable angina (HCC)  Syncope and collapse  Skin tear of elbow without complication, initial encounter     Rx / DC Orders   ED Discharge Orders     None        Note:  This document was prepared using Dragon voice recognition software and may include unintentional dictation errors.    Pilar Jarvis, MD 04/16/23 [phone]

## 2023-04-16 NOTE — ED Notes (Signed)
 2nd attempt to page [facility] to inform of pt critical troponin.

## 2023-04-16 NOTE — Assessment & Plan Note (Signed)
 Stable from a resp standpoint  Cont home inhalers

## 2023-04-16 NOTE — ED Notes (Signed)
 Dr Darrold Junker at bedside with pt for eval and plan of care discussion

## 2023-04-16 NOTE — Assessment & Plan Note (Signed)
 CT head WNL

## 2023-04-16 NOTE — Assessment & Plan Note (Signed)
 Decompensated respiratory failure requiring 2 L nasal cannula on presentation in the setting of NSTEMI. Noted?  Asymmetric edema versus infection in the lung Will check CT of the chest to correlate Continue supplemental oxygen for now Monitor

## 2023-04-16 NOTE — ED Notes (Incomplete)
 CRITICAL VALUE STICKER  CRITICAL VALUE: Troponin >24,000  RECEIVER (on-site recipient of call): Katheran James RN  DATE & TIME NOTIFIED: 04/16/23 12:38 PM  MESSENGER (representative from lab): Lab tech  MD NOTIFIED: [facility] Dr. Alvester Morin  TIME OF NOTIFICATION: 12:38 PM  RESPONSE:

## 2023-04-16 NOTE — Progress Notes (Addendum)
 Chaplain responds to code STEMI and provides compassionate presence as pt is evaluated. No family is present and later nurse shares she'll be contacting family for pt, who will be going for testing imminently. STEMI is canceled.

## 2023-04-16 NOTE — ED Triage Notes (Addendum)
 BIB EMS with CP x 1 week but worse last night and this morning.  STEMI called.  Given 324MG  ASA 86/47  HR 40's 95%RA CBG 20gLAC 50cc fluids.  Also complains of mild sob but denies pain radiating anywhere. Patient also reports of nausea.  Patient reports he passed out prior to ems arrival.  Patient is on eliquis.  Denies hitting head or injuries other than right forearm skin tear.

## 2023-04-16 NOTE — Progress Notes (Addendum)
 PHARMACY - ANTICOAGULATION CONSULT NOTE  Pharmacy Consult for Heparin  Indication: chest pain/ACS  Allergies  Allergen Reactions   Penicillin G Rash    Patient Measurements: Height: 5\' 9"  (175.3 cm) Weight: 73.1 kg (161 lb 2.5 oz) IBW/kg (Calculated) : 70.7 Heparin Dosing Weight: 73.1 kg   Vital Signs: Temp: 97.6 F (36.4 C) (02/26 1901) Temp Source: Oral (02/26 1901) BP: 134/93 (02/26 1901) Pulse Rate: 96 (02/26 1901)  Labs: Recent Labs    04/16/23 0421 04/16/23 1308 04/16/23 1134 04/16/23 1340 04/16/23 1809  HGB 12.7*  --   --   --   --   HCT 38.2*  --   --   --   --   PLT 201  --   --   --   --   APTT 32  --   --  >200* >200*  LABPROT 21.5*  --   --   --   --   INR 1.8*  --   --   --   --   HEPARINUNFRC  --   --   --  >1.10*  --   CREATININE 1.72*  --   --   --   --   TROPONINIHS 259* 4,685* >24,000*  --   --     Estimated Creatinine Clearance: 28.5 mL/min (A) (by C-G formula based on SCr of 1.72 mg/dL (H)).   Medical History: Past Medical History:  Diagnosis Date   Atrial fibrillation (HCC) 03/2023   BPH (benign prostatic hyperplasia)    COPD (chronic obstructive pulmonary disease) (HCC)    Coronary artery disease    HFrEF (heart failure with reduced ejection fraction) (HCC)    LVEF 25-30% by echo in 2022   Hypertension    MI (myocardial infarction) (HCC)    Reactive airway disease     Medications:  Medications Prior to Admission  Medication Sig Dispense Refill Last Dose/Taking   albuterol (VENTOLIN HFA) 108 (90 Base) MCG/ACT inhaler Inhale 1-2 puffs into the lungs every 6 (six) hours as needed for wheezing or shortness of breath.   04/15/2023   apixaban (ELIQUIS) 5 MG TABS tablet Take 5 mg by mouth 2 (two) times daily.   04/15/2023   atorvastatin (LIPITOR) 40 MG tablet Take 1 tablet (40 mg total) by mouth daily. 30 tablet 11 04/15/2023   budesonide-formoterol (SYMBICORT) 160-4.5 MCG/ACT inhaler Inhale 2 puffs into the lungs 2 (two) times daily as  needed (respiratory issues.).   Taking As Needed   colchicine 0.6 MG tablet Take 0.6 mg by mouth 2 (two) times daily. As needed   Taking   ENTRESTO 24-26 MG Take 1 tablet by mouth 2 (two) times daily.   04/15/2023   hydrochlorothiazide (HYDRODIURIL) 25 MG tablet Take 1 tablet by mouth daily.   04/15/2023   isosorbide mononitrate (IMDUR) 30 MG 24 hr tablet Take 1 tablet (30 mg total) by mouth every evening. 30 tablet 1 04/15/2023   metoprolol succinate (TOPROL-XL) 25 MG 24 hr tablet Take 1 tablet by mouth daily.   04/15/2023   nitroGLYCERIN (NITROSTAT) 0.4 MG SL tablet Place 0.4 mg under the tongue every 5 (five) minutes x 3 doses as needed for chest pain.   04/16/2023 Morning   ranolazine (RANEXA) 500 MG 12 hr tablet Take 1 tablet by mouth 2 (two) times daily.   04/15/2023   tamsulosin (FLOMAX) 0.4 MG CAPS capsule Take 1 capsule (0.4 mg total) by mouth daily after supper. 30 capsule 1 04/15/2023   aspirin EC  81 MG tablet Take 81 mg by mouth at bedtime. Swallow whole. (Patient not taking: Reported on 04/16/2023)   Not Taking   clopidogrel (PLAVIX) 75 MG tablet Take 75 mg by mouth at bedtime. (Patient not taking: Reported on 04/16/2023)   Not Taking   pantoprazole (PROTONIX) 40 MG tablet Take 40 mg by mouth at bedtime. (Patient not taking: Reported on 12/13/2019)   Not Taking    Assessment: Pharmacy consulted to dose heparin in this 89 year old male admitted with ACS/NSTEMI. On apixaban PTA - last dose unknown. CrCl = 28.5 ml/min  Date Time HL/aPTT Rate/Comment  0226 1809 aPTT >200 Supratherapeutic on 900 u/hr  Goal of Therapy:  Heparin level 0.3-0.7 units/ml aPTT 66-102 seconds Monitor platelets by anticoagulation protocol: Yes   Plan:  Per RN, no signs/symptoms of bleeding Hold heparin infusion for 1 hour Resume heparin infusion at reduced rate of 700 units/hour Recheck 8 hour aPTT Monitor daily aPTT and heparin levels until correlating, then monitor heparin levels only Monitor CBC and  signs/symptoms of bleeding  Thank you for involving pharmacy in this patient's care.   Rockwell Alexandria, PharmD Clinical Pharmacist 04/16/2023 7:37 PM

## 2023-04-16 NOTE — Progress Notes (Incomplete)
 PHARMACY - ANTICOAGULATION CONSULT NOTE  Pharmacy Consult for Heparin  Indication: chest pain/ACS  Allergies  Allergen Reactions   Penicillin G Rash    Patient Measurements: Height: 5\' 9"  (175.3 cm) Weight: 73.1 kg (161 lb 2.5 oz) IBW/kg (Calculated) : 70.7 Heparin Dosing Weight: 73.1 kg   Vital Signs: Temp: 97.2 F (36.2 C) (02/26 0756) Temp Source: Oral (02/26 0756) BP: 125/75 (02/26 1115) Pulse Rate: 83 (02/26 1115)  Labs: Recent Labs    04/16/23 0421 04/16/23 0613 04/16/23 1134  HGB 12.7*  --   --   HCT 38.2*  --   --   PLT 201  --   --   APTT 32  --   --   LABPROT 21.5*  --   --   INR 1.8*  --   --   CREATININE 1.72*  --   --   TROPONINIHS 259* 4,685* >24,000*    Estimated Creatinine Clearance: 28.5 mL/min (A) (by C-G formula based on SCr of 1.72 mg/dL (H)).   Medical History: Past Medical History:  Diagnosis Date   Atrial fibrillation (HCC) 03/2023   BPH (benign prostatic hyperplasia)    COPD (chronic obstructive pulmonary disease) (HCC)    Coronary artery disease    HFrEF (heart failure with reduced ejection fraction) (HCC)    LVEF 25-30% by echo in 2022   Hypertension    MI (myocardial infarction) (HCC)    Reactive airway disease     Medications:  Eliquis 5 mg PO twice day Last dose: 2/25   Assessment: 89 y.o. male with PMH including recently diagnosed NOAF, HFrEF, HTN, CAD s/p CABG (1997) and DES (2021), who presents with concerns for ACS. cTn trending up to 24,000. Patient with recent new onset Afib prescribed Eliquis which was started last week. CHA2DS2VASc is 5 (CHF, HTN, age +6, MI). Per med rec, last dose of Eliquis was 2/25. Baseline heparin level unknown.  Goal of Therapy:  Heparin level 0.3-0.7 units/ml Monitor platelets by anticoagulation protocol: Yes  Monitoring: Date Time aPTT/HL Rate/Comment *** *** ***  ***      Plan: Give *** units bolus x1; then start heparin infusion at *** units/hour. Check heparin level in ***  hours, then daily once at least two levels are consecutively therapeutic. Monitor CBC daily while on heparin infusion. Ellis Parents START - YES PTA DOAC^^  Give *** units bolus x1; then start heparin infusion at *** units/hour. Check aPTT in *** hours. Continue to titrate by aPTT until heparin level and aPTT correlate and/or *** washes out, then titrate by heparin level alone. Check heparin level with next AM labs. Continue to monitor CBC daily while on heparin infusion. Ellis Parents START - NO PTA DOAC^^  Continue heparin infusion at *** units/hour. Check HL and/or aPTT in *** hours. Continue to monitor CBC daily while on heparin infusion. ^^THERAPEUTIC^^  Give *** units bolus x1; then increase rate of heparin infusion to *** units/hour. Check heparin level and/or aPTT in *** hours, then daily once at least two levels are consecutively therapeutic. Continue to monitor CBC daily while on heparin infusion. ^^SUBTHERAPEUTIC (YES BOLUS)^^  Increase rate of heparin infusion to *** units/hour. Check heparin level and/or aPTT in *** hours, then daily once at least two levels are consecutively therapeutic. Continue to monitor CBC daily while on heparin infusion. ^^SUBTHERAPEUTIC (NO BOLUS)^^  Decrease rate of heparin infusion to *** units/hour. Check heparin level and/or aPTT in *** hours, then daily once at least two levels are consecutively therapeutic. Continue  to monitor CBC daily while on heparin infusion. Margarette Canada   Will M. Dareen Piano, PharmD Clinical Pharmacist 04/16/2023 1:35 PM

## 2023-04-16 NOTE — ED Notes (Signed)
Pt back from ct, tolerated well.  

## 2023-04-16 NOTE — Assessment & Plan Note (Signed)
 Toprol-XL restarted

## 2023-04-16 NOTE — ED Notes (Signed)
 Given 324mg  asa with ems

## 2023-04-16 NOTE — Consult Note (Signed)
 [facility] CLINIC CARDIOLOGY CONSULT NOTE       [name] MRN: [id number] DOB/AGE: [birth year] 89 y.o.  Admit date: 04/16/2023 Referring Physician Dr. Pilar Jarvis Primary Physician Lauro Regulus, MD  Primary Cardiologist Dr. Juliann Pares Reason for Consultation NSTEMI  HPI: Jesus Stuart is a 89 y.o. male  with a past medical history of coronary artery disease s/p CABG with a LIMA to the LAD, SVG to ramus intermedius, SVG to OM2, and SVG to RCA in 1997, PCI of the mid to distal RCA with overlapping Resolute Onyx DES on 11/2019, ischemic cardiomyopathy, frequent PVCs, COPD, and hypertension who presented to the ED on 04/16/2023 for chest pain. Initially paged as code STEMI, this was cancelled by Dr. Okey Dupre. Cardiology was consulted for further evaluation.   Patient reports that over the last month he has been having episodes of chest pain.  These have been intermittent but for the last week he has felt that they have been worse and yesterday while having an asthma attack he had a significant episode of chest pain.  His symptoms persisted throughout the night and he called EMS and was brought to the ED for further evaluation.  Workup in the ED notable for creatinine 1.72, potassium 3.9, hemoglobin 12.7, WBC 10.1.  Lactic acid 5.0.  BNP elevated at 3100.  Troponins trended 259 > 4685.  EKG revealed atrial fibrillation with a right bundle branch block with minimal ST elevation in inferior leads.  Chest x-ray with some concern for pneumonia however this appears to be more likely pulmonary edema on CT chest. He was started on IV heparin in the ED.  At the time of my evaluation this morning patient is resting comfortably in ED stretcher.  He states that he still has chest pain but this is mildly improved from yesterday.  We discussed his symptoms in further detail, he reports associated diaphoresis and nausea with chest pain episode yesterday.  He states that over the last 4 to 6 weeks  he has been having episodes of intermittent chest pain, this typically occurred with exertion.  But he states that about 3 weeks ago he went on a trip to [place] and was playing golf and felt well with this.  He reports that he lives alone and is very active and able to do everything he needs on his own.  He endorses associated shortness of breath and dizziness.  Denies any orthopnea or palpitation symptoms.  States that he did have an episode of syncope last night whenever his pain was significant.  Reports that the pain now is dull in nature and slightly improved.  CODE STATUS was verified and patient states that he is a DNR.  Review of systems complete and found to be negative unless listed above    Past Medical History:  Diagnosis Date   Atrial fibrillation (HCC) 03/2023   BPH (benign prostatic hyperplasia)    COPD (chronic obstructive pulmonary disease) (HCC)    Coronary artery disease    HFrEF (heart failure with reduced ejection fraction) (HCC)    LVEF 25-30% by echo in 2022   Hypertension    MI (myocardial infarction) (HCC)    Reactive airway disease     Past Surgical History:  Procedure Laterality Date   CORONARY ARTERY BYPASS GRAFT     CORONARY STENT INTERVENTION N/A 12/08/2019   Procedure: CORONARY STENT INTERVENTION;  Surgeon: Alwyn Pea, MD;  Location: [facility] INVASIVE CV LAB;  Service: Cardiovascular;  Laterality: N/A;  LEFT HEART CATH AND CORONARY ANGIOGRAPHY Left 12/08/2019   Procedure: LEFT HEART CATH AND CORONARY ANGIOGRAPHY;  Surgeon: Alwyn Pea, MD;  Location: [facility] INVASIVE CV LAB;  Service: Cardiovascular;  Laterality: Left;    (Not in a hospital admission)  Social History   Socioeconomic History   Marital status: Married    Spouse name: Not on file   Number of children: Not on file   Years of education: Not on file   Highest education level: Not on file  Occupational History   Not on file  Tobacco Use   Smoking status: Former   Smokeless  tobacco: Never   Tobacco comments:    quit 38yrs ago  Vaping Use   Vaping status: Never Used  Substance and Sexual Activity   Alcohol use: Never   Drug use: Never   Sexual activity: Never  Other Topics Concern   Not on file  Social History Narrative   Not on file   Social Drivers of Health   Financial Resource Strain: Low Risk  (03/13/2023)   Received from [facility]   Overall Financial Resource Strain (CARDIA)    Difficulty of Paying Living Expenses: Not hard at all  Food Insecurity: No Food Insecurity (03/13/2023)   Received from [facility]   Hunger Vital Sign    Worried About Running Out of Food in the Last Year: Never true    Ran Out of Food in the Last Year: Never true  Transportation Needs: No Transportation Needs (03/13/2023)   Received from [facility] - Transportation    In the past 12 months, has lack of transportation kept you from medical appointments or from getting medications?: No    Lack of Transportation (Non-Medical): No  Physical Activity: Not on file  Stress: Not on file  Social Connections: Not on file  Intimate Partner Violence: Not on file    Family History  Problem Relation Age of Onset   Stroke Mother      Vitals:   04/16/23 0730 04/16/23 0756 04/16/23 0815 04/16/23 0830  BP: 106/69  109/71 112/64  Pulse: 87  89 85  Resp: 17  (!) 25 (!) 26  Temp:  (!) 97.2 F (36.2 C)    TempSrc:  Oral    SpO2: 93%  94% (!) 88%  Weight:      Height:        PHYSICAL EXAM General: Chronically ill appearing elderly male, well nourished, in no acute distress. HEENT: Normocephalic and atraumatic. Neck: No JVD.  Lungs: Normal respiratory effort on 2L [place]. Clear bilaterally to auscultation. No wheezes, crackles, rhonchi.  Heart: Irregularly irregular. Normal S1 and S2 without gallops or murmurs.  Abdomen: Non-distended appearing.  Msk: Normal strength and tone for age. Extremities: Warm and  well perfused. No clubbing, cyanosis. No edema.  Neuro: Alert and oriented X 3. Psych: Answers questions appropriately.   Labs: Basic Metabolic Panel: Recent Labs    04/16/23 0421  NA 135  K 3.9  CL 102  CO2 20*  GLUCOSE 273*  BUN 21  CREATININE 1.72*  CALCIUM 8.4*   Liver Function Tests: Recent Labs    04/16/23 0421  AST 32  ALT 19  ALKPHOS 50  BILITOT 2.4*  PROT 6.1*  ALBUMIN 3.6   No results for input(s): "LIPASE", "AMYLASE" in the last 72 hours. CBC: Recent Labs    04/16/23 0421  WBC 10.1  NEUTROABS 9.1*  HGB 12.7*  HCT 38.2*  MCV 97.2  PLT 201   Cardiac Enzymes: Recent Labs    04/16/23 0421 04/16/23 0613  TROPONINIHS 259* 4,685*   BNP: Recent Labs    04/16/23 0421  BNP 3,152.8*   D-Dimer: No results for input(s): "DDIMER" in the last 72 hours. Hemoglobin A1C: No results for input(s): "HGBA1C" in the last 72 hours. Fasting Lipid Panel: Recent Labs    04/16/23 0421  CHOL 111  HDL 47  LDLCALC 49  TRIG 74  CHOLHDL 2.4   Thyroid Function Tests: No results for input(s): "TSH", "T4TOTAL", "T3FREE", "THYROIDAB" in the last 72 hours.  Invalid input(s): "FREET3" Anemia Panel: No results for input(s): "VITAMINB12", "FOLATE", "FERRITIN", "TIBC", "IRON", "RETICCTPCT" in the last 72 hours.   Radiology: CT CHEST WO CONTRAST Result Date: 04/16/2023 CLINICAL DATA:  Chest pain.  Syncope. EXAM: CT CHEST WITHOUT CONTRAST TECHNIQUE: Multidetector CT imaging of the chest was performed following the standard protocol without IV contrast. RADIATION DOSE REDUCTION: This exam was performed according to the departmental dose-optimization program which includes automated exposure control, adjustment of the mA and/or kV according to patient size and/or use of iterative reconstruction technique. COMPARISON:  Chest radiograph dated 04/16/2023. FINDINGS: Cardiovascular: The heart is enlarged. Prior CABG. Coronary artery calcifications. Nonaneurysmal aorta.  Atherosclerotic calcifications of the thoracic aorta and arch branch vessels. Mediastinum/Nodes: There are several prominent and a few mildly enlarged mediastinal lymph nodes including a 11 mm right paratracheal lymph node and a 12 mm subcarinal lymph node. No enlarged axillary lymph nodes. Trachea and esophagus are unremarkable. Lungs/Pleura: Patchy ground-glass and consolidative opacities with a central peribronchovascular distribution throughout the left lung, most pronounced in the left upper lobe. Less pronounced patchy ground-glass opacities with a predominantly central peribronchovascular distribution in the right lung. Bilateral left-greater-than-right interlobular septal thickening. Trace left pleural effusion. No pneumothorax. Upper Abdomen: Trace upper abdominal ascites. Partially visualized 7 cm cyst arising from the superior pole of the left kidney. Musculoskeletal: No chest wall mass or suspicious bone lesions identified. Prior median sternotomy with discontinuity of multiple cerclage wires again noted. IMPRESSION: 1. Patchy ground-glass and consolidative opacities with a central peribronchovascular distribution throughout the left lung and less pronounced patchy opacities within the right lung with interlobular septal thickening and trace left pleural effusion. These findings are favored to represent cardiogenic pulmonary edema, although, the presence of an underlying infiltrate can not be excluded. 2. Mildly enlarged and prominent mediastinal lymph nodes are likely reactive. 3. Cardiomegaly. 4. Trace upper abdominal ascites. 5. Aortic Atherosclerosis (ICD10-I70.0). Electronically Signed   By: Hart Robinsons M.D.   On: 04/16/2023 09:34   DG Chest Port 1 View Result Date: 04/16/2023 CLINICAL DATA:  Awoke with chest pain EXAM: PORTABLE CHEST 1 VIEW COMPARISON:  12/13/2019 FINDINGS: Stable borderline heart size.  Prior CABG. Asymmetric interstitial and airspace opacity to the left lung which is  significantly obscured by overlapping hardware. No visible pleural effusion or pneumothorax. IMPRESSION: Asymmetric edema versus pneumonia in the left lung. Electronically Signed   By: Tiburcio Pea M.D.   On: 04/16/2023 05:41   CT Head Wo Contrast Result Date: 04/16/2023 CLINICAL DATA:  Nausea and neck trauma. EXAM: CT HEAD WITHOUT CONTRAST CT CERVICAL SPINE WITHOUT CONTRAST TECHNIQUE: Multidetector CT imaging of the head and cervical spine was performed following the standard protocol without intravenous contrast. Multiplanar CT image reconstructions of the cervical spine were also generated. RADIATION DOSE REDUCTION: This exam was performed according to the departmental dose-optimization program which includes automated exposure control, adjustment  of the mA and/or kV according to patient size and/or use of iterative reconstruction technique. COMPARISON:  None Available. FINDINGS: CT HEAD FINDINGS Brain: No evidence of acute infarction, hemorrhage, hydrocephalus, extra-axial collection or mass lesion/mass effect. Chronic small vessel disease in the cerebral white matter that is mild. 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 Vascular: No hyperdense vessel or unexpected calcification. Skull: Normal. Negative for fracture or focal lesion. Sinuses/Orbits: No evidence of injury. CT CERVICAL SPINE FINDINGS Alignment: No traumatic malalignment Skull base and vertebrae: No acute fracture.  Subjective osteopenia Soft tissues and spinal canal: No prevertebral fluid or swelling. No visible canal hematoma. Disc levels: Generalized degenerative endplate and facet spurring in keeping with age. Upper chest: Partially covered airspace disease at the left apex. IMPRESSION: 1. No evidence of acute intracranial or cervical spine injury. 2. Partially covered left apical opacity, correlate with pending chest radiograph. Electronically Signed   By: Tiburcio Pea M.D.   On: 04/16/2023 05:18   CT Cervical Spine Wo  Contrast Result Date: 04/16/2023 CLINICAL DATA:  Nausea and neck trauma. EXAM: CT HEAD WITHOUT CONTRAST CT CERVICAL SPINE WITHOUT CONTRAST TECHNIQUE: Multidetector CT imaging of the head and cervical spine was performed following the standard protocol without intravenous contrast. Multiplanar CT image reconstructions of the cervical spine were also generated. RADIATION DOSE REDUCTION: This exam was performed according to the departmental dose-optimization program which includes automated exposure control, adjustment of the mA and/or kV according to patient size and/or use of iterative reconstruction technique. COMPARISON:  None Available. FINDINGS: CT HEAD FINDINGS Brain: No evidence of acute infarction, hemorrhage, hydrocephalus, extra-axial collection or mass lesion/mass effect. Chronic small vessel disease in the cerebral white matter that is mild. 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 Vascular: No hyperdense vessel or unexpected calcification. Skull: Normal. Negative for fracture or focal lesion. Sinuses/Orbits: No evidence of injury. CT CERVICAL SPINE FINDINGS Alignment: No traumatic malalignment Skull base and vertebrae: No acute fracture.  Subjective osteopenia Soft tissues and spinal canal: No prevertebral fluid or swelling. No visible canal hematoma. Disc levels: Generalized degenerative endplate and facet spurring in keeping with age. Upper chest: Partially covered airspace disease at the left apex. IMPRESSION: 1. No evidence of acute intracranial or cervical spine injury. 2. Partially covered left apical opacity, correlate with pending chest radiograph. Electronically Signed   By: Tiburcio Pea M.D.   On: 04/16/2023 05:18    ECHO ordered  TELEMETRY reviewed by me 04/16/2023: atrial fibrillation RBBB PVCs rate 80s  EKG reviewed by me: AF RBBB rate 89 bpm, minimal STE inferior leads  Data reviewed by me 04/16/2023: last 24h vitals tele labs imaging I/O ED provider note, admission  H&P  Principal Problem:   NSTEMI (non-ST elevated myocardial infarction) (HCC) Active Problems:   COPD (chronic obstructive pulmonary disease) (HCC)   Essential hypertension   AKI (acute kidney injury) (HCC)   Atrial fibrillation (HCC)   Syncope   Acute respiratory failure with hypoxia (HCC)    ASSESSMENT AND PLAN:  Jesus Stuart is a 89 y.o. male  with a past medical history of coronary artery disease s/p CABG with a LIMA to the LAD, SVG to ramus intermedius, SVG to OM2, and SVG to RCA in 1997, PCI of the mid to distal RCA with overlapping Resolute Onyx DES on 11/2019, ischemic cardiomyopathy, frequent PVCs, COPD, and hypertension who presented to the ED on 04/16/2023 for chest pain. Initially paged as code STEMI, this was cancelled by Dr. Okey Dupre. Cardiology was consulted for further  evaluation.   # NSTEMI # Coronary artery disease s/p CABG 1997 # Acute on chronic HFrEF Patient presented with significant chest pain episode last night, initially code STEMI was called for inferior ST elevation however this was canceled as this had improved on repeat EKG in the ED.  Troponin trended 259 > 4685. -S/p ASA 325 with EMS.  Continue aspirin 81 mg daily and atorvastatin 40 mg daily.  He had been taking aspirin and Plavix until earlier this month when these were discontinued and he was started on Eliquis for atrial fibrillation. -Home Entresto and metoprolol succinate held at this time given borderline BP.  Plan to resume as able. -Patient would benefit from cardiac catheterization for further evaluation given elevated troponins and chest pain on presentation.  However given he had his last dose of Eliquis last night and his creatinine is elevated today at 1.7, will plan for heart catheterization tomorrow for more time off of Eliquis and to give renal function time to improve.  Patient aware of plan and amenable to proceeding.  # Persistent atrial fibrillation Patient with history of atrial  fibrillation.  In atrial fibrillation with right bundle branch block on EKG in the ED.  Rate overall controlled. -Eliquis held pending cath.  This was started earlier this month for newly diagnosed atrial fibrillation. -Home metoprolol succinate held currently due to low BP.  Plan to resume as BP improves.   TIMI Risk Score for Unstable Angina or Non-ST Elevation MI:   The patient's TIMI risk score is 6, which indicates a 41% risk of all cause mortality, new or recurrent myocardial infarction or need for urgent revascularization in the next 14 days.   This patient's plan of care was discussed and created with Dr. Darrold Junker and he is in agreement.  Signed: Gale Journey, PA-C  04/16/2023, 10:42 AM [facility] Cardiology

## 2023-04-16 NOTE — H&P (Addendum)
 History and Physical    Patient: Jesus Stuart ZOX:[id number] DOB: [birth year] DOA: 04/16/2023 DOS: the patient was seen and examined on 04/16/2023 PCP: Lauro Regulus, MD  Patient coming from: Home  Chief Complaint:  Chief Complaint  Patient presents with   Code STEMI   HPI: Jesus Stuart is a 89 y.o. male with medical history significant of CAD status post CABG, COPD, HFrEF, hypertension, atrial fibrillation presenting with NSTEMI, atrial fibrillation, syncope.  Patient reports having central chest pain for roughly 1 week or so.  Was seen by [facility] cardiology on February 11 with concern for chest pain.  Noted had new onset atrial fibrillation.  Started on Eliquis at that time.  Chest pressure has persisted.  Noted remote history of MI in the past.  Symptoms are not similar.  No shortness of breath.  No abdominal pain.  No nausea or vomiting.  No lower extremity swelling or orthopnea.  Has been compliant with home regimen.  Was initially evaluated as a code STEMI in the field with noted inferior ST elevations.  Was evaluated by Dr. Okey Dupre on arrival to the ER with resolution of ST elevations.  Code STEMI canceled.  Noted syncopal event associated with chest pain.  No report of focal hemiparesis or confusion. Presented to the ER afebrile, hemodynamically stable.  Satting upper 80s on room air.  Placed on 2L [place] to keep O2 sats >94%. White count 10.1, hemoglobin 12.7, platelets 201, troponin 259--> 4685.  Creatinine 1.72.  Lactate of 5.  BNP of 3152.  Chest x-ray with asymmetric edema versus pneumonia of the left lung, CT head stable, CT C-spine stable. Review of Systems: As mentioned in the history of present illness. All other systems reviewed and are negative. Past Medical History:  Diagnosis Date   Atrial fibrillation (HCC) 03/2023   BPH (benign prostatic hyperplasia)    COPD (chronic obstructive pulmonary disease) (HCC)    Coronary artery disease    HFrEF (heart failure with reduced  ejection fraction) (HCC)    LVEF 25-30% by echo in 2022   Hypertension    MI (myocardial infarction) (HCC)    Reactive airway disease    Past Surgical History:  Procedure Laterality Date   CORONARY ARTERY BYPASS GRAFT     CORONARY STENT INTERVENTION N/A 12/08/2019   Procedure: CORONARY STENT INTERVENTION;  Surgeon: Alwyn Pea, MD;  Location: [facility] INVASIVE CV LAB;  Service: Cardiovascular;  Laterality: N/A;   LEFT HEART CATH AND CORONARY ANGIOGRAPHY Left 12/08/2019   Procedure: LEFT HEART CATH AND CORONARY ANGIOGRAPHY;  Surgeon: Alwyn Pea, MD;  Location: [facility] INVASIVE CV LAB;  Service: Cardiovascular;  Laterality: Left;   Social History:  reports that he has quit smoking. He has never used smokeless tobacco. He reports that he does not drink alcohol and does not use drugs.  Allergies  Allergen Reactions   Penicillin G Rash    Family History  Problem Relation Age of Onset   Stroke Mother     Prior to Admission medications   Medication Sig Start Date End Date Taking? Authorizing Provider  albuterol (VENTOLIN HFA) 108 (90 Base) MCG/ACT inhaler Inhale 1-2 puffs into the lungs every 6 (six) hours as needed for wheezing or shortness of breath. 10/16/19   [provider]  aspirin EC 81 MG tablet Take 81 mg by mouth at bedtime. Swallow whole.    [provider]  atorvastatin (LIPITOR) 40 MG tablet Take 1 tablet (40 mg total) by mouth daily. 12/09/19  Dalia Heading, MD  budesonide-formoterol [facility]) 160-4.5 MCG/ACT inhaler Inhale 2 puffs into the lungs 2 (two) times daily as needed (respiratory issues.).    [provider]  clopidogrel (PLAVIX) 75 MG tablet Take 75 mg by mouth at bedtime. 09/27/19   [provider]  isosorbide mononitrate (IMDUR) 30 MG 24 hr tablet Take 1 tablet (30 mg total) by mouth every evening. 12/18/19   Lynn Ito, MD  nitroGLYCERIN (NITROSTAT) 0.4 MG SL tablet Place 0.4 mg under the tongue every 5 (five)  minutes x 3 doses as needed for chest pain. 10/22/19   [provider]  pantoprazole (PROTONIX) 40 MG tablet Take 40 mg by mouth at bedtime. Patient not taking: Reported on 12/13/2019 11/03/19   [provider]  tamsulosin (FLOMAX) 0.4 MG CAPS capsule Take 1 capsule (0.4 mg total) by mouth daily after supper. 12/18/19   Lynn Ito, MD    Physical Exam: Vitals:   04/16/23 0615 04/16/23 0700 04/16/23 0730 04/16/23 0756  BP: 106/62 104/63 106/69   Pulse: 84 86 87   Resp: (!) 29 (!) 26 17   Temp:    (!) 97.2 F (36.2 C)  TempSrc:    Oral  SpO2: 93% 93% 93%   Weight:      Height:       Physical Exam Constitutional:      Appearance: He is normal weight.  HENT:     Head: Normocephalic and atraumatic.     Nose: Nose normal.     Mouth/Throat:     Mouth: Mucous membranes are dry.  Eyes:     Pupils: Pupils are equal, round, and reactive to light.  Cardiovascular:     Rate and Rhythm: Normal rate and regular rhythm.  Pulmonary:     Effort: Pulmonary effort is normal.  Abdominal:     General: Bowel sounds are normal.  Musculoskeletal:        General: Normal range of motion.     Comments: + generalized weakness    Skin:    General: Skin is dry.  Neurological:     General: No focal deficit present.  Psychiatric:        Mood and Affect: Mood normal.     Data Reviewed:  There are no new results to review at this time.  Assessment and Plan: * NSTEMI (non-ST elevated myocardial infarction) (HCC) Central chest pressure x 1 week w/ acute decompensation over last 12-24 hours  Baseline CAD s/p stenting 11/2019  Initial concern for inferior STEMI in the field w/ EMS- resolved on arrival to the ER  Trop 260-->4680  On heparin gtt  EKG w/ rate controlled atrial fibrillation  Dr. Darrold Junker w/ cardiology consulted- follow up recommendations    Atrial fibrillation (HCC) + atrial fibrillation on EKG in setting of NSTEMI  Relatively new diagnosis with patient being  recently started on Eliquis in the outpatient cardiology setting Currently on heparin gtt 2D ECHO  Follow up cardiology recommendations    Acute respiratory failure with hypoxia (HCC) Decompensated respiratory failure requiring 2 L nasal cannula on presentation in the setting of NSTEMI. Noted?  Asymmetric edema versus infection in the lung Will check CT of the chest to correlate Continue supplemental oxygen for now Monitor  Syncope + syncopal event in setting of NSTEMI  CT head WNL  Started on heparin gtt  2D ECHO  LLN BP--> IVF hydration  PT/OT eval  Monitor    AKI (acute kidney injury) (HCC) Creatinine 1.7 with GFR  in the 30s on presentation Clinically dry IV fluid hydration Renal imaging as clinically indicated Hold nephrotoxic agents Monitor  Essential hypertension LLN BP  Hold BP regimen    COPD (chronic obstructive pulmonary disease) (HCC) Stable from a resp standpoint  Cont home inhalers     Greater than 50% was spent in counseling and coordination of care with patient Critical care time: 60+  minutes    Advance Care Planning:   Code Status: Limited: Do not attempt resuscitation (DNR) -DNR-LIMITED -Do Not Intubate/DNI    Consults: Cardiology   Family Communication: No family at the bedside   Severity of Illness: The appropriate patient status for this patient is INPATIENT. Inpatient status is judged to be reasonable and necessary in order to provide the required intensity of service to ensure the patient's safety. The patient's presenting symptoms, physical exam findings, and initial radiographic and laboratory data in the context of their chronic comorbidities is felt to place them at high risk for further clinical deterioration. Furthermore, it is not anticipated that the patient will be medically stable for discharge from the hospital within 2 midnights of admission.   * I certify that at the point of admission it is my clinical judgment that the  patient will require inpatient hospital care spanning beyond 2 midnights from the point of admission due to high intensity of service, high risk for further deterioration and high frequency of surveillance required.*  Author: Floydene Flock, MD 04/16/2023 7:57 AM  For on call review [email or web address].

## 2023-04-16 NOTE — ED Notes (Signed)
 Pt accompanied to ct with this RN and ct tech

## 2023-04-16 NOTE — Assessment & Plan Note (Addendum)
 AKI on CKD stage IIIa.  Creatinine worsened up to 2.02 on 2/27.  Continue to improve and now at baseline of 1.27 baseline creatinine about 1.2-1.4.   -Monitor renal function -Avoid nephrotoxin

## 2023-04-16 NOTE — Progress Notes (Signed)
*  PRELIMINARY RESULTS* Echocardiogram 2D Echocardiogram has been performed.  Carolyne Fiscal 04/16/2023, 1:42 PM

## 2023-04-16 NOTE — Progress Notes (Signed)
 PT Cancellation Note  Patient Details Name: Jesus Stuart MRN: [id number] DOB: [birth year]   Cancelled Treatment:    Reason Eval/Treat Not Completed: Medical issues which prohibited therapy.  Order received, chart reviewed. Pt noted to be pending cardiac cath next date and troponin continuing to trend up (>24,000). Will hold and initiate services as pt medically appropriate.   Olga Coaster PT, DPT 1:27 PM,04/16/23

## 2023-04-16 NOTE — ED Notes (Signed)
 Family at bedside, pt resting quietly with eyes closed, no distress noted, easily awakened with conversation

## 2023-04-17 ENCOUNTER — Inpatient Hospital Stay: Payer: Medicare Other

## 2023-04-17 DIAGNOSIS — I214 Non-ST elevation (NSTEMI) myocardial infarction: Secondary | ICD-10-CM | POA: Diagnosis not present

## 2023-04-17 DIAGNOSIS — I5023 Acute on chronic systolic (congestive) heart failure: Secondary | ICD-10-CM

## 2023-04-17 DIAGNOSIS — I482 Chronic atrial fibrillation, unspecified: Secondary | ICD-10-CM

## 2023-04-17 DIAGNOSIS — J9601 Acute respiratory failure with hypoxia: Secondary | ICD-10-CM | POA: Diagnosis not present

## 2023-04-17 DIAGNOSIS — N189 Chronic kidney disease, unspecified: Secondary | ICD-10-CM

## 2023-04-17 DIAGNOSIS — J439 Emphysema, unspecified: Secondary | ICD-10-CM | POA: Diagnosis not present

## 2023-04-17 DIAGNOSIS — N179 Acute kidney failure, unspecified: Secondary | ICD-10-CM | POA: Diagnosis not present

## 2023-04-17 DIAGNOSIS — I10 Essential (primary) hypertension: Secondary | ICD-10-CM

## 2023-04-17 LAB — CBC WITH DIFFERENTIAL/PLATELET
Abs Immature Granulocytes: 0.05 10*3/uL (ref 0.00–0.07)
Basophils Absolute: 0 10*3/uL (ref 0.0–0.1)
Basophils Relative: 0 %
Eosinophils Absolute: 0 10*3/uL (ref 0.0–0.5)
Eosinophils Relative: 0 %
HCT: 35.7 % — ABNORMAL LOW (ref 39.0–52.0)
Hemoglobin: 12.2 g/dL — ABNORMAL LOW (ref 13.0–17.0)
Immature Granulocytes: 1 %
Lymphocytes Relative: 11 %
Lymphs Abs: 0.9 10*3/uL (ref 0.7–4.0)
MCH: 32.1 pg (ref 26.0–34.0)
MCHC: 34.2 g/dL (ref 30.0–36.0)
MCV: 93.9 fL (ref 80.0–100.0)
Monocytes Absolute: 0.8 10*3/uL (ref 0.1–1.0)
Monocytes Relative: 10 %
Neutro Abs: 6.4 10*3/uL (ref 1.7–7.7)
Neutrophils Relative %: 78 %
Platelets: 179 10*3/uL (ref 150–400)
RBC: 3.8 MIL/uL — ABNORMAL LOW (ref 4.22–5.81)
RDW: 15.2 % (ref 11.5–15.5)
WBC: 8.2 10*3/uL (ref 4.0–10.5)
nRBC: 0 % (ref 0.0–0.2)

## 2023-04-17 LAB — ECHOCARDIOGRAM COMPLETE
AR max vel: 2.96 cm2
AV Area VTI: 3.03 cm2
AV Area mean vel: 2.76 cm2
AV Mean grad: 2.7 mm[Hg]
AV Peak grad: 5.6 mm[Hg]
Ao pk vel: 1.18 m/s
Area-P 1/2: 4.49 cm2
Height: 69 in
MV VTI: 1.62 cm2
S' Lateral: 4.65 cm
Weight: 2578.5 [oz_av]

## 2023-04-17 LAB — COMPREHENSIVE METABOLIC PANEL
ALT: 35 U/L (ref 0–44)
AST: 122 U/L — ABNORMAL HIGH (ref 15–41)
Albumin: 3.3 g/dL — ABNORMAL LOW (ref 3.5–5.0)
Alkaline Phosphatase: 40 U/L (ref 38–126)
Anion gap: 11 (ref 5–15)
BUN: 36 mg/dL — ABNORMAL HIGH (ref 8–23)
CO2: 19 mmol/L — ABNORMAL LOW (ref 22–32)
Calcium: 8.1 mg/dL — ABNORMAL LOW (ref 8.9–10.3)
Chloride: 103 mmol/L (ref 98–111)
Creatinine, Ser: 2.02 mg/dL — ABNORMAL HIGH (ref 0.61–1.24)
GFR, Estimated: 31 mL/min — ABNORMAL LOW (ref >60)
Glucose, Bld: 114 mg/dL — ABNORMAL HIGH (ref 70–99)
Potassium: 3.9 mmol/L (ref 3.5–5.1)
Sodium: 133 mmol/L — ABNORMAL LOW (ref 135–145)
Total Bilirubin: 1.7 mg/dL — ABNORMAL HIGH (ref 0.0–1.2)
Total Protein: 5.7 g/dL — ABNORMAL LOW (ref 6.5–8.1)

## 2023-04-17 LAB — APTT
aPTT: 70 s — ABNORMAL HIGH (ref 24–36)
aPTT: 51 s — ABNORMAL HIGH (ref 24–36)
aPTT: 58 s — ABNORMAL HIGH (ref 24–36)

## 2023-04-17 LAB — HEPARIN LEVEL (UNFRACTIONATED): Heparin Unfractionated: >1.1 [IU]/mL — ABNORMAL HIGH (ref 0.30–0.70)

## 2023-04-17 LAB — HEMOGLOBIN A1C
Hgb A1c MFr Bld: 5.7 % — ABNORMAL HIGH (ref 4.8–5.6)
Mean Plasma Glucose: 117 mg/dL

## 2023-04-17 LAB — C DIFFICILE QUICK SCREEN W PCR REFLEX
C Diff antigen: NEGATIVE
C Diff interpretation: NOT DETECTED
C Diff toxin: NEGATIVE

## 2023-04-17 MED ORDER — IPRATROPIUM-ALBUTEROL 0.5-2.5 (3) MG/3ML IN SOLN: 3.0000 mL | Freq: Four times a day (QID) | RESPIRATORY_TRACT | Status: DC

## 2023-04-17 MED ORDER — HEPARIN BOLUS VIA INFUSION
1100.0000 [IU] | Freq: Once | INTRAVENOUS | Status: AC
  Administered 2023-04-17: 1100 [IU] via INTRAVENOUS
  Filled 2023-04-17: qty 1100

## 2023-04-17 MED ORDER — AZITHROMYCIN 250 MG PO TABS
250.0000 mg | ORAL_TABLET | Freq: Every day | ORAL | Status: AC
  Administered 2023-04-18 – 2023-04-21 (×4): 250 mg via ORAL
  Filled 2023-04-17 (×4): qty 1

## 2023-04-17 MED ORDER — ALBUTEROL SULFATE HFA 108 (90 BASE) MCG/ACT IN AERS
2.0000 | INHALATION_SPRAY | Freq: Four times a day (QID) | RESPIRATORY_TRACT | Status: DC | PRN
  Administered 2023-04-17 – 2023-04-21 (×4): 2 via RESPIRATORY_TRACT
  Filled 2023-04-17: qty 6.7

## 2023-04-17 MED ORDER — METOPROLOL SUCCINATE ER 25 MG PO TB24
12.5000 mg | ORAL_TABLET | Freq: Every day | ORAL | Status: DC
  Administered 2023-04-17: 12.5 mg via ORAL
  Filled 2023-04-17: qty 1

## 2023-04-17 MED ORDER — MELATONIN 5 MG PO TABS
5.0000 mg | ORAL_TABLET | Freq: Every evening | ORAL | Status: DC | PRN
  Administered 2023-04-17 – 2023-04-20 (×4): 5 mg via ORAL
  Filled 2023-04-17 (×4): qty 1

## 2023-04-17 MED ORDER — SODIUM CHLORIDE 0.9 % IV SOLN
2.0000 g | INTRAVENOUS | Status: DC
  Administered 2023-04-17 – 2023-04-20 (×4): 2 g via INTRAVENOUS
  Filled 2023-04-17 (×5): qty 20

## 2023-04-17 MED ORDER — IPRATROPIUM-ALBUTEROL 0.5-2.5 (3) MG/3ML IN SOLN
3.0000 mL | Freq: Four times a day (QID) | RESPIRATORY_TRACT | Status: DC
  Administered 2023-04-17: 3 mL via RESPIRATORY_TRACT

## 2023-04-17 MED ORDER — MOMETASONE FURO-FORMOTEROL FUM 100-5 MCG/ACT IN AERO
2.0000 | INHALATION_SPRAY | Freq: Two times a day (BID) | RESPIRATORY_TRACT | Status: DC
  Administered 2023-04-17 – 2023-04-22 (×11): 2 via RESPIRATORY_TRACT
  Filled 2023-04-17: qty 8.8

## 2023-04-17 MED ORDER — AZITHROMYCIN 250 MG PO TABS
500.0000 mg | ORAL_TABLET | Freq: Every day | ORAL | Status: AC
  Administered 2023-04-17: 500 mg via ORAL
  Filled 2023-04-17: qty 2

## 2023-04-17 MED ORDER — HEPARIN BOLUS VIA INFUSION
2000.0000 [IU] | Freq: Once | INTRAVENOUS | Status: AC
  Administered 2023-04-17: 2000 [IU] via INTRAVENOUS
  Filled 2023-04-17: qty 2000

## 2023-04-17 NOTE — Assessment & Plan Note (Signed)
 Restarted on Toprol.  Other medications limited secondary to acute kidney injury and hypotension

## 2023-04-17 NOTE — Hospital Course (Addendum)
 89 y.o. male with medical history significant of CAD status post CABG, COPD, HFrEF, hypertension, atrial fibrillation presenting with NSTEMI, atrial fibrillation, syncope.  Patient reports having central chest pain for roughly 1 week or so.  Was seen by [facility] cardiology on February 11 with concern for chest pain.  Noted had new onset atrial fibrillation.  Started on Eliquis at that time.  Chest pressure has persisted.  Noted remote history of MI in the past.  Symptoms are not similar.  No shortness of breath.  No abdominal pain.  No nausea or vomiting.  No lower extremity swelling or orthopnea.  Has been compliant with home regimen.  Was initially evaluated as a code STEMI in the field with noted inferior ST elevations.  Was evaluated by Dr. Okey Dupre on arrival to the ER with resolution of ST elevations.  Code STEMI canceled.  Noted syncopal event associated with chest pain.  No report of focal hemiparesis or confusion. Presented to the ER afebrile, hemodynamically stable.  Satting upper 80s on room air.  Placed on 2L  to keep O2 sats >94%. White count 10.1, hemoglobin 12.7, platelets 201, troponin 259--> 4685.  Creatinine 1.72.  Lactate of 5.  BNP of 3152.  Chest x-ray with asymmetric edema versus pneumonia of the left lung, CT head stable, CT C-spine stable.  2/27.  With creatinine elevated 2.02, cardiac catheterization was canceled.  Patient on heparin drip.  Patient complains of shortness of breath and asking for his inhalers. 2/28.  Creatinine down to 1.51 today. 3/1: Creatinine now at baseline, 1.27 today.  Potassium 3.3 with magnesium of 2.3.  Cardiac cath is being scheduled for Monday.  Eliquis is being held from today's p.m. dose as recommended by cardiology. 3/3: Had cardiac cath today Has occluded SVG to RCA and 99% stenosis of SVG to small caliber OM2. Intervention deferred given age of graft and also concern of renal function.  They are recommending medical management.  Patient will resume Eliquis  from tomorrow.  Developed new onset bilateral feet pain, it was sharp and patient was unable to stand.  Starting on gabapentin and tramadol as needed, patient would like to stay another night as he is unable to walk at this time due to this pain.  3/4: Remained hemodynamically stable and appears to be at baseline.  Foot pain controlled with tramadol and gabapentin.  Patient was given a prescription of low-dose gabapentin and some tramadol and advised to use only if Tylenol does not work.   His home HCTZ was discontinued and Sherryll Burger was held as advised by cardiology.  They will restart as outpatient if blood pressure allows.  Patient will continue the rest of his home medications and follow-up with his providers for further assistance.

## 2023-04-17 NOTE — Progress Notes (Signed)
 Physical Therapy Evaluation Patient Details Name: Jesus Stuart MRN: [id number] DOB: 23-Jul-1930 Today's Date: 04/17/2023  History of Present Illness  89 y.o. male  with a past medical history of coronary artery disease s/p CABG with a LIMA to the LAD, SVG to ramus intermedius, SVG to OM2, and SVG to RCA in 1997, PCI of the mid to distal RCA with overlapping Resolute Onyx DES on 11/2019, ischemic cardiomyopathy, frequent PVCs, COPD, and hypertension who presented to the ED on 04/16/2023 for chest pain  Clinical Impression  Pt was pleasant and willing to work with PT, though does endorse not getting much sleep and was eager to get back to bed (refused sitting up in recliner) due to this.  He ultimately moved very well with good confidence and functional strength t/o.  Pt with no LOBs or overt unsteadiness and generally moved with easy confidence.  Pt somewhat self limiting on this eval but ulimately did well.  Suspect he will not need PT f/u once medically ready for d/c, will maintain on caseload with plan for continued activity, longer bouts of ambulation and assess stair negotiation.       If plan is discharge home, recommend the following: A little help with walking and/or transfers   Can travel by private vehicle        Equipment Recommendations None recommended by PT  Recommendations for Other Services       Functional Status Assessment Patient has not had a recent decline in their functional status     Precautions / Restrictions Precautions Precautions: Fall Restrictions Weight Bearing Restrictions Per Provider Order: No      Mobility  Bed Mobility Overal bed mobility: Independent                  Transfers Overall transfer level: Independent Equipment used: None               General transfer comment: easily and confidently able to rise to standing w/o issue    Ambulation/Gait Ambulation/Gait assistance: Modified independent (Device/Increase  time) Gait Distance (Feet): 75 Feet Assistive device: None         General Gait Details: Pt with good safety, confidence, cadence and stable vitals with moderate ambulation effort.  He reports feeling like he wants to sleep and not do too much walking,  but given his speed and confidence likely could have walked 100s of feet easily if he had to.  Stairs            Wheelchair Mobility     Tilt Bed    Modified Rankin (Stroke Patients Only)       Balance Overall balance assessment: Modified Independent                                           Pertinent Vitals/Pain Pain Assessment Pain Assessment: No/denies pain    Home Living Family/patient expects to be discharged to:: Private residence Living Arrangements: Spouse/significant other;Alone     Home Access: Stairs to enter Entrance Stairs-Rails:  ("yes") Secretary/administrator of Steps: 4   Home Layout: One level        Prior Function Prior Level of Function : Independent/Modified Independent;Driving             Mobility Comments: reports he does all his errands, stays active, etc ADLs Comments: reports independence     Extremity/Trunk Assessment  Upper Extremity Assessment Upper Extremity Assessment: Overall WFL for tasks assessed    Lower Extremity Assessment Lower Extremity Assessment: Overall WFL for tasks assessed       Communication   Communication Communication: No apparent difficulties    Cognition   Behavior During Therapy: WFL for tasks assessed/performed   PT - Cognitive impairments: No apparent impairments                         Following commands: Intact       Cueing       General Comments General comments (skin integrity, edema, etc.): Pt moved well with safety and confidence.    Exercises     Assessment/Plan    PT Assessment Patient needs continued PT services (will maintain on caseload while admitted to insure maintainance activty  and safe discharge)  PT Problem List Decreased activity tolerance;Decreased safety awareness       PT Treatment Interventions DME instruction;Gait training;Stair training;Functional mobility training;Therapeutic activities;Therapeutic exercise;Balance training;Wheelchair mobility training    PT Goals (Current goals can be found in the Care Plan section)  Acute Rehab PT Goals Patient Stated Goal: Go home PT Goal Formulation: With patient Time For Goal Achievement: 04/30/23 Potential to Achieve Goals: Good    Frequency Min 1X/week     Co-evaluation               AM-PAC PT "6 Clicks" Mobility  Outcome Measure Help needed turning from your back to your side while in a flat bed without using bedrails?: None Help needed moving from lying on your back to sitting on the side of a flat bed without using bedrails?: None Help needed moving to and from a bed to a chair (including a wheelchair)?: None Help needed standing up from a chair using your arms (e.g., wheelchair or bedside chair)?: None Help needed to walk in hospital room?: A Little Help needed climbing 3-5 steps with a railing? : A Little 6 Click Score: 22    End of Session Equipment Utilized During Treatment: Gait belt Activity Tolerance: Patient tolerated treatment well Patient left: with bed alarm set;with call bell/phone within reach   PT Visit Diagnosis: Muscle weakness (generalized) (M62.81)    Time: 6644-0347 PT Time Calculation (min) (ACUTE ONLY): 12 min   Charges:   PT Evaluation $PT Eval Low Complexity: 1 Low   PT General Charges $$ ACUTE PT VISIT: 1 Visit         Malachi Pro, DPT 04/17/2023, 10:07 AM

## 2023-04-17 NOTE — Progress Notes (Signed)
 Progress Note   Patient: Jesus Stuart ZOX:[id number] DOB: 14-Sep-1930 DOA: 04/16/2023     1 DOS: the patient was seen and examined on 04/17/2023   Brief hospital course: 89 y.o. male with medical history significant of CAD status post CABG, COPD, HFrEF, hypertension, atrial fibrillation presenting with NSTEMI, atrial fibrillation, syncope.  Patient reports having central chest pain for roughly 1 week or so.  Was seen by [facility] cardiology on February 11 with concern for chest pain.  Noted had new onset atrial fibrillation.  Started on Eliquis at that time.  Chest pressure has persisted.  Noted remote history of MI in the past.  Symptoms are not similar.  No shortness of breath.  No abdominal pain.  No nausea or vomiting.  No lower extremity swelling or orthopnea.  Has been compliant with home regimen.  Was initially evaluated as a code STEMI in the field with noted inferior ST elevations.  Was evaluated by Dr. Okey Dupre on arrival to the ER with resolution of ST elevations.  Code STEMI canceled.  Noted syncopal event associated with chest pain.  No report of focal hemiparesis or confusion. Presented to the ER afebrile, hemodynamically stable.  Satting upper 80s on room air.  Placed on 2L [place] to keep O2 sats >94%. White count 10.1, hemoglobin 12.7, platelets 201, troponin 259--> 4685.  Creatinine 1.72.  Lactate of 5.  BNP of 3152.  Chest x-ray with asymmetric edema versus pneumonia of the left lung, CT head stable, CT C-spine stable.  2/27.  With creatinine elevated 2.02, cardiac catheterization was canceled.  Patient on heparin drip.  Patient complains of shortness of breath and asking for his inhalers.  Assessment and Plan: * NSTEMI (non-ST elevated myocardial infarction) (HCC) Troponin greater than 24,000.  Patient on heparin drip, aspirin and Lipitor.  LDL 49.  Cardiac catheterization today canceled secondary to creatinine elevation.  Toprol on hold with relative hypotension.   Acute kidney injury  superimposed on CKD (HCC) AKI on CKD stage IIIa.  Creatinine worsened up to 2.02.  On gentle IV fluid.  Baseline creatinine about 1.2.  COPD (chronic obstructive pulmonary disease) (HCC) Restart inhalers with wheezing today.  Obtain another chest x-ray.  Acute respiratory failure with hypoxia (HCC) 1 pulse ox of 89% on room air.  Continue oxygen and try to taper off.  Acute on chronic heart failure with reduced ejection fraction (HFrEF, <= 40%) (HCC) Holding on medications secondary to relative hypotension.  Atrial fibrillation (HCC) Persistent in nature.  On heparin drip right now.  Eliquis on hold for cardiac cath.   Essential hypertension Holding antihypertensive medications with relative hypotension.  Syncope CT head WNL           Subjective: Patient complains of some shortness of breath but lying flat.  Does have some coughing.  He states he has had asthma for well over 30 years and thinks it is his asthma.  Admitted with NSTEMI.  Physical Exam: Vitals:   04/17/23 0343 04/17/23 0800 04/17/23 1046 04/17/23 1122  BP: 122/80 110/81  109/76  Pulse: 85 88  89  Resp: 15     Temp: 97.6 F (36.4 C) 97.6 F (36.4 C)  98.2 F (36.8 C)  TempSrc: Oral Oral  Oral  SpO2: 95% 98% 96% 97%  Weight:      Height:       Physical Exam HENT:     Head: Normocephalic.     Mouth/Throat:     Pharynx: No oropharyngeal exudate.  Eyes:  General: Lids are normal.     Conjunctiva/sclera: Conjunctivae normal.  Cardiovascular:     Rate and Rhythm: Normal rate and regular rhythm.     Heart sounds: Normal heart sounds, S1 normal and S2 normal.  Pulmonary:     Breath sounds: Examination of the right-middle field reveals decreased breath sounds and wheezing. Examination of the left-middle field reveals decreased breath sounds and wheezing. Examination of the right-lower field reveals decreased breath sounds and rhonchi. Examination of the left-lower field reveals decreased breath sounds  and rhonchi. Decreased breath sounds, wheezing and rhonchi present.  Abdominal:     Palpations: Abdomen is soft.     Tenderness: There is no abdominal tenderness.  Musculoskeletal:     Right lower leg: No swelling.     Left lower leg: No swelling.  Skin:    General: Skin is warm.     Findings: No rash.  Neurological:     Mental Status: He is alert and oriented to person, place, and time.     Data Reviewed: Sodium 133.  Creatinine 2.02,, CO2 19, AST 122, total bilirubin 1.7, troponin greater than 24,000, lactic acid 2.2, hemoglobin 12.2  Family Communication: Left message for daughter  Disposition: Status is: Inpatient Remains inpatient appropriate because: Patient admitted with NSTEMI.  With acute kidney injury cardiac cath canceled today.  Planned Discharge Destination: Home    Time spent: 28 minutes, case discussed with cardiology  Author: Alford Highland, MD 04/17/2023 2:17 PM  For on call review [email or web address].

## 2023-04-17 NOTE — Progress Notes (Signed)
 PHARMACY - ANTICOAGULATION CONSULT NOTE  Pharmacy Consult for Heparin  Indication: chest pain/ACS  Allergies  Allergen Reactions   Penicillin G Rash    Patient Measurements: Height: 5\' 9"  (175.3 cm) Weight: 72.4 kg (159 lb 9.8 oz) IBW/kg (Calculated) : 70.7 Heparin Dosing Weight: 73.1 kg   Vital Signs: Temp: 98.2 F (36.8 C) (02/27 1122) Temp Source: Oral (02/27 1122) BP: 109/76 (02/27 1122) Pulse Rate: 89 (02/27 1122)  Labs: Recent Labs    04/16/23 0421 04/16/23 1478 04/16/23 1134 04/16/23 1340 04/16/23 1809 04/17/23 0447 04/17/23 1238  HGB 12.7*  --   --   --   --  12.2*  --   HCT 38.2*  --   --   --   --  35.7*  --   PLT 201  --   --   --   --  179  --   APTT 32  --   --  >200* >200* 70* 51*  LABPROT 21.5*  --   --   --   --   --   --   INR 1.8*  --   --   --   --   --   --   HEPARINUNFRC  --   --   --  >1.10*  --  >1.10*  --   CREATININE 1.72*  --   --   --   --  2.02*  --   TROPONINIHS 259* 4,685* >24,000*  --   --   --   --     Estimated Creatinine Clearance: 24.3 mL/min (A) (by C-G formula based on SCr of 2.02 mg/dL (H)).   Medical History: Past Medical History:  Diagnosis Date   Atrial fibrillation (HCC) 03/2023   BPH (benign prostatic hyperplasia)    COPD (chronic obstructive pulmonary disease) (HCC)    Coronary artery disease    HFrEF (heart failure with reduced ejection fraction) (HCC)    LVEF 25-30% by echo in 2022   Hypertension    MI (myocardial infarction) (HCC)    Reactive airway disease     Medications:  Medications Prior to Admission  Medication Sig Dispense Refill Last Dose/Taking   albuterol (VENTOLIN HFA) 108 (90 Base) MCG/ACT inhaler Inhale 1-2 puffs into the lungs every 6 (six) hours as needed for wheezing or shortness of breath.   04/15/2023   apixaban (ELIQUIS) 5 MG TABS tablet Take 5 mg by mouth 2 (two) times daily.   04/15/2023   atorvastatin (LIPITOR) 40 MG tablet Take 1 tablet (40 mg total) by mouth daily. 30 tablet 11  04/15/2023   budesonide-formoterol (SYMBICORT) 160-4.5 MCG/ACT inhaler Inhale 2 puffs into the lungs 2 (two) times daily as needed (respiratory issues.).   Taking As Needed   colchicine 0.6 MG tablet Take 0.6 mg by mouth 2 (two) times daily. As needed   Taking   ENTRESTO 24-26 MG Take 1 tablet by mouth 2 (two) times daily.   04/15/2023   hydrochlorothiazide (HYDRODIURIL) 25 MG tablet Take 1 tablet by mouth daily.   04/15/2023   isosorbide mononitrate (IMDUR) 30 MG 24 hr tablet Take 1 tablet (30 mg total) by mouth every evening. 30 tablet 1 04/15/2023   metoprolol succinate (TOPROL-XL) 25 MG 24 hr tablet Take 1 tablet by mouth daily.   04/15/2023   nitroGLYCERIN (NITROSTAT) 0.4 MG SL tablet Place 0.4 mg under the tongue every 5 (five) minutes x 3 doses as needed for chest pain.   04/16/2023 Morning   ranolazine (RANEXA)  500 MG 12 hr tablet Take 1 tablet by mouth 2 (two) times daily.   04/15/2023   tamsulosin (FLOMAX) 0.4 MG CAPS capsule Take 1 capsule (0.4 mg total) by mouth daily after supper. 30 capsule 1 04/15/2023   aspirin EC 81 MG tablet Take 81 mg by mouth at bedtime. Swallow whole. (Patient not taking: Reported on 04/16/2023)   Not Taking   clopidogrel (PLAVIX) 75 MG tablet Take 75 mg by mouth at bedtime. (Patient not taking: Reported on 04/16/2023)   Not Taking   pantoprazole (PROTONIX) 40 MG tablet Take 40 mg by mouth at bedtime. (Patient not taking: Reported on 12/13/2019)   Not Taking    Assessment: Pharmacy consulted to dose heparin in this 89 year old male admitted with ACS/NSTEMI. On apixaban PTA - last dose unknown. CrCl = 28.5 ml/min  Date Time HL/aPTT Rate/Comment  0226 1809 aPTT >200 Supratherapeutic on 900 u/hr 0227    0447   >1.10, 70         Therapeutic X 1 0227 1238 -- / 51  Subtherapeutic on 700 u/hr  Goal of Therapy:  Heparin level 0.3-0.7 units/ml aPTT 66-102 seconds Monitor platelets by anticoagulation protocol: Yes   Plan:  - aPTT subtherapeutic  - bolus 2000 units x  1 - Increase heparin infusion rate to 850 units/hr - Will recheck aPTT in 8 hours after rate change - Will recheck HL on 2/28 with AM labs Monitor daily aPTT and heparin levels until correlating, then monitor heparin levels only Monitor CBC and signs/symptoms of bleeding(hgb continues to trend down, MD aware)  Thank you for involving pharmacy in this patient's care.   Bettey Costa Clinical Pharmacist 04/17/2023 1:16 PM

## 2023-04-17 NOTE — Plan of Care (Signed)
  Problem: Education: Goal: Understanding of cardiac disease, CV risk reduction, and recovery process will improve Outcome: Progressing   Problem: Education: Goal: Understanding of cardiac disease, CV risk reduction, and recovery process will improve Outcome: Progressing   Problem: Activity: Goal: Ability to tolerate increased activity will improve Outcome: Progressing   Problem: Cardiac: Goal: Ability to achieve and maintain adequate cardiovascular perfusion will improve Outcome: Progressing   Problem: Clinical Measurements: Goal: Ability to maintain clinical measurements within normal limits will improve Outcome: Progressing

## 2023-04-17 NOTE — Progress Notes (Signed)
 Transition of Care [facility]) - Progression Note    Patient Details  Name: Jesus Stuart MRN: [id number] Date of Birth: [birth year]  [facility]) CM/SW Contact  Truddie Hidden, RN Phone Number: 04/17/2023, 3:06 PM  Clinical Narrative:    TOC continuing to follow patient's progress throughout discharge planning.        Expected Discharge Plan and Services                                               Social Determinants of Health (SDOH) Interventions SDOH Screenings   Food Insecurity: No Food Insecurity (04/16/2023)  Housing: Low Risk  (04/16/2023)  Transportation Needs: No Transportation Needs (04/16/2023)  Utilities: Not At Risk (04/16/2023)  Financial Resource Strain: Low Risk  (03/13/2023)   Received from [facility]  Social Connections: Socially Integrated (04/16/2023)  Tobacco Use: Medium Risk (04/16/2023)    Readmission Risk Interventions     No data to display

## 2023-04-17 NOTE — Progress Notes (Signed)
 OT Cancellation Note  Patient Details Name: Jesus Stuart MRN: [id number] DOB: 07-14-1930   Cancelled Treatment:    Reason Eval/Treat Not Completed: OT screened, no needs identified, will sign off. OT spoke with pt and daughter regarding need for OT services with pt reporting he thinks he is doing fine and has no acute OT needs. Brief education on energy conservation, pacing, ways to modify/adapt activities to maximize ease and prevent overexertion. Pt and daughter verbalized understanding and are okay with OT screen only. They would like to continue to work with PT or mobility specialist to promote time OOB/pt up walking as tolerated. OT can be re-consulted if new needs arise.  Constance Goltz 04/17/2023, 3:45 PM

## 2023-04-17 NOTE — Progress Notes (Signed)
 [facility] CLINIC CARDIOLOGY PROGRESS NOTE       [name] MRN: [id number] DOB/AGE: [birth year] 89 y.o.  Admit date: 04/16/2023 Referring Physician Dr. Pilar Jarvis Primary Physician Lauro Regulus, MD  Primary Cardiologist Dr. Juliann Pares Reason for Consultation NSTEMI  HPI: Jesus Stuart is a 89 y.o. male  with a past medical history of coronary artery disease s/p CABG with a LIMA to the LAD, SVG to ramus intermedius, SVG to OM2, and SVG to RCA in 1997, PCI of the mid to distal RCA with overlapping Resolute Onyx DES on 11/2019, ischemic cardiomyopathy, frequent PVCs, COPD, and hypertension who presented to the ED on 04/16/2023 for chest pain. Initially paged as code STEMI, this was cancelled by Dr. Okey Dupre. Cardiology was consulted for further evaluation.   Interval history: -Patient seen and examined this AM, resting in bed with son present at beside. -Cr uptrending this AM to 2.02. Patient reports normal UOP.  -States his chest pain has improved, not bothering him much any more.  -BP and HR remain stable.  Review of systems complete and found to be negative unless listed above    Past Medical History:  Diagnosis Date   Atrial fibrillation (HCC) 03/2023   BPH (benign prostatic hyperplasia)    COPD (chronic obstructive pulmonary disease) (HCC)    Coronary artery disease    HFrEF (heart failure with reduced ejection fraction) (HCC)    LVEF 25-30% by echo in 2022   Hypertension    MI (myocardial infarction) (HCC)    Reactive airway disease     Past Surgical History:  Procedure Laterality Date   CORONARY ARTERY BYPASS GRAFT     CORONARY STENT INTERVENTION N/A 12/08/2019   Procedure: CORONARY STENT INTERVENTION;  Surgeon: Alwyn Pea, MD;  Location: [facility] INVASIVE CV LAB;  Service: Cardiovascular;  Laterality: N/A;   LEFT HEART CATH AND CORONARY ANGIOGRAPHY Left 12/08/2019   Procedure: LEFT HEART CATH AND CORONARY ANGIOGRAPHY;  Surgeon: Alwyn Pea, MD;  Location: [facility] INVASIVE CV LAB;  Service: Cardiovascular;  Laterality: Left;    Medications Prior to Admission  Medication Sig Dispense Refill Last Dose/Taking   albuterol (VENTOLIN HFA) 108 (90 Base) MCG/ACT inhaler Inhale 1-2 puffs into the lungs every 6 (six) hours as needed for wheezing or shortness of breath.   04/15/2023   apixaban (ELIQUIS) 5 MG TABS tablet Take 5 mg by mouth 2 (two) times daily.   04/15/2023   atorvastatin (LIPITOR) 40 MG tablet Take 1 tablet (40 mg total) by mouth daily. 30 tablet 11 04/15/2023   budesonide-formoterol (SYMBICORT) 160-4.5 MCG/ACT inhaler Inhale 2 puffs into the lungs 2 (two) times daily as needed (respiratory issues.).   Taking As Needed   colchicine 0.6 MG tablet Take 0.6 mg by mouth 2 (two) times daily. As needed   Taking   ENTRESTO 24-26 MG Take 1 tablet by mouth 2 (two) times daily.   04/15/2023   hydrochlorothiazide (HYDRODIURIL) 25 MG tablet Take 1 tablet by mouth daily.   04/15/2023   isosorbide mononitrate (IMDUR) 30 MG 24 hr tablet Take 1 tablet (30 mg total) by mouth every evening. 30 tablet 1 04/15/2023   metoprolol succinate (TOPROL-XL) 25 MG 24 hr tablet Take 1 tablet by mouth daily.   04/15/2023   nitroGLYCERIN (NITROSTAT) 0.4 MG SL tablet Place 0.4 mg under the tongue every 5 (five) minutes x 3 doses as needed for chest pain.   04/16/2023 Morning   ranolazine (RANEXA) 500 MG 12 hr  tablet Take 1 tablet by mouth 2 (two) times daily.   04/15/2023   tamsulosin (FLOMAX) 0.4 MG CAPS capsule Take 1 capsule (0.4 mg total) by mouth daily after supper. 30 capsule 1 04/15/2023   aspirin EC 81 MG tablet Take 81 mg by mouth at bedtime. Swallow whole. (Patient not taking: Reported on 04/16/2023)   Not Taking   clopidogrel (PLAVIX) 75 MG tablet Take 75 mg by mouth at bedtime. (Patient not taking: Reported on 04/16/2023)   Not Taking   pantoprazole (PROTONIX) 40 MG tablet Take 40 mg by mouth at bedtime. (Patient not taking: Reported on 12/13/2019)   Not Taking    Social History   Socioeconomic History   Marital status: Married    Spouse name: Not on file   Number of children: Not on file   Years of education: Not on file   Highest education level: Not on file  Occupational History   Not on file  Tobacco Use   Smoking status: Former   Smokeless tobacco: Never   Tobacco comments:    quit 75yrs ago  Vaping Use   Vaping status: Never Used  Substance and Sexual Activity   Alcohol use: Never   Drug use: Never   Sexual activity: Never  Other Topics Concern   Not on file  Social History Narrative   Not on file   Social Drivers of Health   Financial Resource Strain: Low Risk  (03/13/2023)   Received from [facility]   Overall Financial Resource Strain (CARDIA)    Difficulty of Paying Living Expenses: Not hard at all  Food Insecurity: No Food Insecurity (04/16/2023)   Hunger Vital Sign    Worried About Running Out of Food in the Last Year: Never true    Ran Out of Food in the Last Year: Never true  Transportation Needs: No Transportation Needs (04/16/2023)   PRAPARE - Administrator, Civil Service (Medical): No    Lack of Transportation (Non-Medical): No  Physical Activity: Not on file  Stress: Not on file  Social Connections: Socially Integrated (04/16/2023)   Social Connection and Isolation Panel [NHANES]    Frequency of Communication with Friends and Family: Three times a week    Frequency of Social Gatherings with Friends and Family: Three times a week    Attends Religious Services: More than 4 times per year    Active Member of Clubs or Organizations: No    Attends Banker Meetings: More than 4 times per year    Marital Status: Married  Catering manager Violence: Not At Risk (04/16/2023)   Humiliation, Afraid, Rape, and Kick questionnaire    Fear of Current or Ex-Partner: No    Emotionally Abused: No    Physically Abused: No    Sexually Abused: No    Family History  Problem Relation  Age of Onset   Stroke Mother      Vitals:   04/16/23 1901 04/16/23 1923 04/16/23 2300 04/17/23 0343  BP: (!) 134/93  90/67 122/80  Pulse: 96  78 85  Resp: 20  18 15   Temp: 97.6 F (36.4 C)  97.6 F (36.4 C) 97.6 F (36.4 C)  TempSrc: Oral  Oral Oral  SpO2: 96%  95% 95%  Weight:  72.4 kg    Height:        PHYSICAL EXAM General: Chronically ill appearing elderly male, well nourished, in no acute distress. HEENT: Normocephalic and atraumatic. Neck: No JVD.  Lungs:  Normal respiratory effort on RA. Clear bilaterally to auscultation. No wheezes, crackles, rhonchi.  Heart: Irregularly irregular. Normal S1 and S2 without gallops or murmurs.  Abdomen: Non-distended appearing.  Msk: Normal strength and tone for age. Extremities: Warm and well perfused. No clubbing, cyanosis. No edema.  Neuro: Alert and oriented X 3. Psych: Answers questions appropriately.   Labs: Basic Metabolic Panel: Recent Labs    04/16/23 0421 04/17/23 0447  NA 135 133*  K 3.9 3.9  CL 102 103  CO2 20* 19*  GLUCOSE 273* 114*  BUN 21 36*  CREATININE 1.72* 2.02*  CALCIUM 8.4* 8.1*   Liver Function Tests: Recent Labs    04/16/23 0421 04/17/23 0447  AST 32 122*  ALT 19 35  ALKPHOS 50 40  BILITOT 2.4* 1.7*  PROT 6.1* 5.7*  ALBUMIN 3.6 3.3*   No results for input(s): "LIPASE", "AMYLASE" in the last 72 hours. CBC: Recent Labs    04/16/23 0421 04/17/23 0447  WBC 10.1 8.2  NEUTROABS 9.1* 6.4  HGB 12.7* 12.2*  HCT 38.2* 35.7*  MCV 97.2 93.9  PLT 201 179   Cardiac Enzymes: Recent Labs    04/16/23 0421 04/16/23 0613 04/16/23 1134  TROPONINIHS 259* 4,685* >24,000*   BNP: Recent Labs    04/16/23 0421  BNP 3,152.8*   D-Dimer: No results for input(s): "DDIMER" in the last 72 hours. Hemoglobin A1C: Recent Labs    04/16/23 0421  HGBA1C 5.7*   Fasting Lipid Panel: Recent Labs    04/16/23 0421  CHOL 111  HDL 47  LDLCALC 49  TRIG 74  CHOLHDL 2.4   Thyroid Function Tests: No  results for input(s): "TSH", "T4TOTAL", "T3FREE", "THYROIDAB" in the last 72 hours.  Invalid input(s): "FREET3" Anemia Panel: No results for input(s): "VITAMINB12", "FOLATE", "FERRITIN", "TIBC", "IRON", "RETICCTPCT" in the last 72 hours.   Radiology: CT CHEST WO CONTRAST Result Date: 04/16/2023 CLINICAL DATA:  Chest pain.  Syncope. EXAM: CT CHEST WITHOUT CONTRAST TECHNIQUE: Multidetector CT imaging of the chest was performed following the standard protocol without IV contrast. RADIATION DOSE REDUCTION: This exam was performed according to the departmental dose-optimization program which includes automated exposure control, adjustment of the mA and/or kV according to patient size and/or use of iterative reconstruction technique. COMPARISON:  Chest radiograph dated 04/16/2023. FINDINGS: Cardiovascular: The heart is enlarged. Prior CABG. Coronary artery calcifications. Nonaneurysmal aorta. Atherosclerotic calcifications of the thoracic aorta and arch branch vessels. Mediastinum/Nodes: There are several prominent and a few mildly enlarged mediastinal lymph nodes including a 11 mm right paratracheal lymph node and a 12 mm subcarinal lymph node. No enlarged axillary lymph nodes. Trachea and esophagus are unremarkable. Lungs/Pleura: Patchy ground-glass and consolidative opacities with a central peribronchovascular distribution throughout the left lung, most pronounced in the left upper lobe. Less pronounced patchy ground-glass opacities with a predominantly central peribronchovascular distribution in the right lung. Bilateral left-greater-than-right interlobular septal thickening. Trace left pleural effusion. No pneumothorax. Upper Abdomen: Trace upper abdominal ascites. Partially visualized 7 cm cyst arising from the superior pole of the left kidney. Musculoskeletal: No chest wall mass or suspicious bone lesions identified. Prior median sternotomy with discontinuity of multiple cerclage wires again noted.  IMPRESSION: 1. Patchy ground-glass and consolidative opacities with a central peribronchovascular distribution throughout the left lung and less pronounced patchy opacities within the right lung with interlobular septal thickening and trace left pleural effusion. These findings are favored to represent cardiogenic pulmonary edema, although, the presence of an underlying infiltrate can not be excluded. 2. Mildly  enlarged and prominent mediastinal lymph nodes are likely reactive. 3. Cardiomegaly. 4. Trace upper abdominal ascites. 5. Aortic Atherosclerosis (ICD10-I70.0). Electronically Signed   By: Hart Robinsons M.D.   On: 04/16/2023 09:34   DG Chest Port 1 View Result Date: 04/16/2023 CLINICAL DATA:  Awoke with chest pain EXAM: PORTABLE CHEST 1 VIEW COMPARISON:  12/13/2019 FINDINGS: Stable borderline heart size.  Prior CABG. Asymmetric interstitial and airspace opacity to the left lung which is significantly obscured by overlapping hardware. No visible pleural effusion or pneumothorax. IMPRESSION: Asymmetric edema versus pneumonia in the left lung. Electronically Signed   By: Tiburcio Pea M.D.   On: 04/16/2023 05:41   CT Head Wo Contrast Result Date: 04/16/2023 CLINICAL DATA:  Nausea and neck trauma. EXAM: CT HEAD WITHOUT CONTRAST CT CERVICAL SPINE WITHOUT CONTRAST TECHNIQUE: Multidetector CT imaging of the head and cervical spine was performed following the standard protocol without intravenous contrast. Multiplanar CT image reconstructions of the cervical spine were also generated. RADIATION DOSE REDUCTION: This exam was performed according to the departmental dose-optimization program which includes automated exposure control, adjustment of the mA and/or kV according to patient size and/or use of iterative reconstruction technique. COMPARISON:  None Available. FINDINGS: CT HEAD FINDINGS Brain: No evidence of acute infarction, hemorrhage, hydrocephalus, extra-axial collection or mass lesion/mass effect.  Chronic small vessel disease in the cerebral white matter that is mild. 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 Vascular: No hyperdense vessel or unexpected calcification. Skull: Normal. Negative for fracture or focal lesion. Sinuses/Orbits: No evidence of injury. CT CERVICAL SPINE FINDINGS Alignment: No traumatic malalignment Skull base and vertebrae: No acute fracture.  Subjective osteopenia Soft tissues and spinal canal: No prevertebral fluid or swelling. No visible canal hematoma. Disc levels: Generalized degenerative endplate and facet spurring in keeping with age. Upper chest: Partially covered airspace disease at the left apex. IMPRESSION: 1. No evidence of acute intracranial or cervical spine injury. 2. Partially covered left apical opacity, correlate with pending chest radiograph. Electronically Signed   By: Tiburcio Pea M.D.   On: 04/16/2023 05:18   CT Cervical Spine Wo Contrast Result Date: 04/16/2023 CLINICAL DATA:  Nausea and neck trauma. EXAM: CT HEAD WITHOUT CONTRAST CT CERVICAL SPINE WITHOUT CONTRAST TECHNIQUE: Multidetector CT imaging of the head and cervical spine was performed following the standard protocol without intravenous contrast. Multiplanar CT image reconstructions of the cervical spine were also generated. RADIATION DOSE REDUCTION: This exam was performed according to the departmental dose-optimization program which includes automated exposure control, adjustment of the mA and/or kV according to patient size and/or use of iterative reconstruction technique. COMPARISON:  None Available. FINDINGS: CT HEAD FINDINGS Brain: No evidence of acute infarction, hemorrhage, hydrocephalus, extra-axial collection or mass lesion/mass effect. Chronic small vessel disease in the cerebral white matter that is mild. 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 Vascular: No hyperdense vessel or unexpected calcification. Skull: Normal. Negative for fracture or focal lesion.  Sinuses/Orbits: No evidence of injury. CT CERVICAL SPINE FINDINGS Alignment: No traumatic malalignment Skull base and vertebrae: No acute fracture.  Subjective osteopenia Soft tissues and spinal canal: No prevertebral fluid or swelling. No visible canal hematoma. Disc levels: Generalized degenerative endplate and facet spurring in keeping with age. Upper chest: Partially covered airspace disease at the left apex. IMPRESSION: 1. No evidence of acute intracranial or cervical spine injury. 2. Partially covered left apical opacity, correlate with pending chest radiograph. Electronically Signed   By: Tiburcio Pea M.D.   On: 04/16/2023 05:18  ECHO pending  TELEMETRY reviewed by me 04/17/2023: atrial fibrillation RBBB PVCs rate 80s  EKG reviewed by me: AF RBBB rate 89 bpm, minimal STE inferior leads  Data reviewed by me 04/17/2023: last 24h vitals tele labs imaging I/O hospitalist progress note  Principal Problem:   NSTEMI (non-ST elevated myocardial infarction) (HCC) Active Problems:   COPD (chronic obstructive pulmonary disease) (HCC)   Essential hypertension   AKI (acute kidney injury) (HCC)   Atrial fibrillation (HCC)   Syncope   Acute respiratory failure with hypoxia (HCC)    ASSESSMENT AND PLAN:  Jesus Stuart is a 89 y.o. male  with a past medical history of coronary artery disease s/p CABG with a LIMA to the LAD, SVG to ramus intermedius, SVG to OM2, and SVG to RCA in 1997, PCI of the mid to distal RCA with overlapping Resolute Onyx DES on 11/2019, ischemic cardiomyopathy, frequent PVCs, COPD, and hypertension who presented to the ED on 04/16/2023 for chest pain. Initially paged as code STEMI, this was cancelled by Dr. Okey Dupre. Cardiology was consulted for further evaluation.   # NSTEMI # Coronary artery disease s/p CABG 1997 # Acute on chronic HFrEF Patient presented with significant chest pain episode last night, initially code STEMI was called for inferior ST elevation however this  was canceled as this had improved on repeat EKG in the ED.  Troponin trended 259 > 4685. -Echo pending -Continue heparin.  -S/p ASA 325 with EMS.  Continue aspirin 81 mg daily and atorvastatin 40 mg daily.  He had been taking aspirin and Plavix until earlier this month when these were discontinued and he was started on Eliquis for atrial fibrillation. -Will resume home metoprolol at decreased dose. Home Entresto held at this time given borderline BP and uptrending renal function.  Plan to resume as able. -Patient would benefit from cardiac catheterization for further evaluation given elevated troponins and chest pain on presentation. Plan was to proceed with heart catheterization today however with his uptrending Cr will postpone.   # Persistent atrial fibrillation Patient with history of atrial fibrillation.  In atrial fibrillation with right bundle branch block on EKG in the ED.  Rate overall controlled. -Eliquis held pending cath.  This was started earlier this month for newly diagnosed atrial fibrillation. -Home metoprolol succinate held currently due to low BP.  Plan to resume as BP improves.   This patient's plan of care was discussed and created with Dr. Darrold Junker and he is in agreement.  Signed: Gale Journey, PA-C  04/17/2023, 7:51 AM [facility]

## 2023-04-17 NOTE — Progress Notes (Addendum)
 OT Cancellation Note  Patient Details Name: KEON PENDER MRN: [id number] DOB: 1931-01-05   Cancelled Treatment:    Reason Eval/Treat Not Completed: Other (comment). Order received, chart reviewed and eval attempted. NT coming out of room to report to nurse that pt is gargling and having trouble breathing, may be calling respiratory. OT to hold off at this time and follow up again as appropriate. Attempt x2 this afternoon and pt off the floor. Per PT, pt likely will be an OT screen.  Constance Goltz 04/17/2023, 10:25 AM

## 2023-04-17 NOTE — Progress Notes (Signed)
 PHARMACY - ANTICOAGULATION CONSULT NOTE  Pharmacy Consult for Heparin  Indication: chest pain/ACS  Allergies  Allergen Reactions   Penicillin G Rash    Patient Measurements: Height: 5\' 9"  (175.3 cm) Weight: 72.4 kg (159 lb 9.8 oz) IBW/kg (Calculated) : 70.7 Heparin Dosing Weight: 73.1 kg   Vital Signs: Temp: 97.6 F (36.4 C) (02/27 0343) Temp Source: Oral (02/27 0343) BP: 122/80 (02/27 0343) Pulse Rate: 85 (02/27 0343)  Labs: Recent Labs    04/16/23 0421 04/16/23 4401 04/16/23 1134 04/16/23 1340 04/16/23 1809 04/17/23 0447  HGB 12.7*  --   --   --   --  12.2*  HCT 38.2*  --   --   --   --  35.7*  PLT 201  --   --   --   --  179  APTT 32  --   --  >200* >200* 70*  LABPROT 21.5*  --   --   --   --   --   INR 1.8*  --   --   --   --   --   HEPARINUNFRC  --   --   --  >1.10*  --  >1.10*  CREATININE 1.72*  --   --   --   --  2.02*  TROPONINIHS 259* 4,685* >24,000*  --   --   --     Estimated Creatinine Clearance: 24.3 mL/min (A) (by C-G formula based on SCr of 2.02 mg/dL (H)).   Medical History: Past Medical History:  Diagnosis Date   Atrial fibrillation (HCC) 03/2023   BPH (benign prostatic hyperplasia)    COPD (chronic obstructive pulmonary disease) (HCC)    Coronary artery disease    HFrEF (heart failure with reduced ejection fraction) (HCC)    LVEF 25-30% by echo in 2022   Hypertension    MI (myocardial infarction) (HCC)    Reactive airway disease     Medications:  Medications Prior to Admission  Medication Sig Dispense Refill Last Dose/Taking   albuterol (VENTOLIN HFA) 108 (90 Base) MCG/ACT inhaler Inhale 1-2 puffs into the lungs every 6 (six) hours as needed for wheezing or shortness of breath.   04/15/2023   apixaban (ELIQUIS) 5 MG TABS tablet Take 5 mg by mouth 2 (two) times daily.   04/15/2023   atorvastatin (LIPITOR) 40 MG tablet Take 1 tablet (40 mg total) by mouth daily. 30 tablet 11 04/15/2023   budesonide-formoterol (SYMBICORT) 160-4.5 MCG/ACT  inhaler Inhale 2 puffs into the lungs 2 (two) times daily as needed (respiratory issues.).   Taking As Needed   colchicine 0.6 MG tablet Take 0.6 mg by mouth 2 (two) times daily. As needed   Taking   ENTRESTO 24-26 MG Take 1 tablet by mouth 2 (two) times daily.   04/15/2023   hydrochlorothiazide (HYDRODIURIL) 25 MG tablet Take 1 tablet by mouth daily.   04/15/2023   isosorbide mononitrate (IMDUR) 30 MG 24 hr tablet Take 1 tablet (30 mg total) by mouth every evening. 30 tablet 1 04/15/2023   metoprolol succinate (TOPROL-XL) 25 MG 24 hr tablet Take 1 tablet by mouth daily.   04/15/2023   nitroGLYCERIN (NITROSTAT) 0.4 MG SL tablet Place 0.4 mg under the tongue every 5 (five) minutes x 3 doses as needed for chest pain.   04/16/2023 Morning   ranolazine (RANEXA) 500 MG 12 hr tablet Take 1 tablet by mouth 2 (two) times daily.   04/15/2023   tamsulosin (FLOMAX) 0.4 MG CAPS capsule Take 1  capsule (0.4 mg total) by mouth daily after supper. 30 capsule 1 04/15/2023   aspirin EC 81 MG tablet Take 81 mg by mouth at bedtime. Swallow whole. (Patient not taking: Reported on 04/16/2023)   Not Taking   clopidogrel (PLAVIX) 75 MG tablet Take 75 mg by mouth at bedtime. (Patient not taking: Reported on 04/16/2023)   Not Taking   pantoprazole (PROTONIX) 40 MG tablet Take 40 mg by mouth at bedtime. (Patient not taking: Reported on 12/13/2019)   Not Taking    Assessment: Pharmacy consulted to dose heparin in this 89 year old male admitted with ACS/NSTEMI. On apixaban PTA - last dose unknown. CrCl = 28.5 ml/min  Date Time HL/aPTT Rate/Comment  0226 1809 aPTT >200 Supratherapeutic on 900 u/hr 0227    0447   >1.10, 70         Therapeutic X 1  Goal of Therapy:  Heparin level 0.3-0.7 units/ml aPTT 66-102 seconds Monitor platelets by anticoagulation protocol: Yes   Plan:  2/27 @ 0447:  aPTT = 70,  HL = > 1.10 - aPTT therapeutic X 1,  HL elevated from PTA Eliquis - Will continue pt on current rate and recheck aPTT in 8 hrs  on 1300. - Will recheck HL on 2/28 with AM labs Monitor daily aPTT and heparin levels until correlating, then monitor heparin levels only Monitor CBC and signs/symptoms of bleeding  Thank you for involving pharmacy in this patient's care.   Lynnox Girten D Clinical Pharmacist 04/17/2023 6:11 AM

## 2023-04-17 NOTE — Progress Notes (Signed)
 Heparin confirmed with second nurse Earney Mallet RN. Heparin running at 9 ml/hr.

## 2023-04-17 NOTE — Progress Notes (Signed)
 PHARMACY - ANTICOAGULATION CONSULT NOTE  Pharmacy Consult for Heparin  Indication: chest pain/ACS  Allergies  Allergen Reactions   Penicillin G Rash    Patient Measurements: Height: 5\' 9"  (175.3 cm) Weight: 72.4 kg (159 lb 9.8 oz) IBW/kg (Calculated) : 70.7 Heparin Dosing Weight: 73.1 kg   Vital Signs: Temp: 98.1 F (36.7 C) (02/27 2025) Temp Source: Oral (02/27 1555) BP: 108/72 (02/27 2025) Pulse Rate: 94 (02/27 2025)  Labs: Recent Labs    04/16/23 0421 04/16/23 9604 04/16/23 1134 04/16/23 1340 04/16/23 1809 04/17/23 0447 04/17/23 1238 04/17/23 2130  HGB 12.7*  --   --   --   --  12.2*  --   --   HCT 38.2*  --   --   --   --  35.7*  --   --   PLT 201  --   --   --   --  179  --   --   APTT 32  --   --  >200*   < > 70* 51* 58*  LABPROT 21.5*  --   --   --   --   --   --   --   INR 1.8*  --   --   --   --   --   --   --   HEPARINUNFRC  --   --   --  >1.10*  --  >1.10*  --   --   CREATININE 1.72*  --   --   --   --  2.02*  --   --   TROPONINIHS 259* 4,685* >24,000*  --   --   --   --   --    < > = values in this interval not displayed.    Estimated Creatinine Clearance: 24.3 mL/min (A) (by C-G formula based on SCr of 2.02 mg/dL (H)).   Medical History: Past Medical History:  Diagnosis Date   Atrial fibrillation (HCC) 03/2023   BPH (benign prostatic hyperplasia)    COPD (chronic obstructive pulmonary disease) (HCC)    Coronary artery disease    HFrEF (heart failure with reduced ejection fraction) (HCC)    LVEF 25-30% by echo in 2022   Hypertension    MI (myocardial infarction) (HCC)    Reactive airway disease     Medications:  Medications Prior to Admission  Medication Sig Dispense Refill Last Dose/Taking   albuterol (VENTOLIN HFA) 108 (90 Base) MCG/ACT inhaler Inhale 1-2 puffs into the lungs every 6 (six) hours as needed for wheezing or shortness of breath.   04/15/2023   apixaban (ELIQUIS) 5 MG TABS tablet Take 5 mg by mouth 2 (two) times daily.    04/15/2023   atorvastatin (LIPITOR) 40 MG tablet Take 1 tablet (40 mg total) by mouth daily. 30 tablet 11 04/15/2023   budesonide-formoterol (SYMBICORT) 160-4.5 MCG/ACT inhaler Inhale 2 puffs into the lungs 2 (two) times daily as needed (respiratory issues.).   Taking As Needed   colchicine 0.6 MG tablet Take 0.6 mg by mouth 2 (two) times daily. As needed   Taking   ENTRESTO 24-26 MG Take 1 tablet by mouth 2 (two) times daily.   04/15/2023   hydrochlorothiazide (HYDRODIURIL) 25 MG tablet Take 1 tablet by mouth daily.   04/15/2023   isosorbide mononitrate (IMDUR) 30 MG 24 hr tablet Take 1 tablet (30 mg total) by mouth every evening. 30 tablet 1 04/15/2023   metoprolol succinate (TOPROL-XL) 25 MG 24 hr tablet Take 1  tablet by mouth daily.   04/15/2023   nitroGLYCERIN (NITROSTAT) 0.4 MG SL tablet Place 0.4 mg under the tongue every 5 (five) minutes x 3 doses as needed for chest pain.   04/16/2023 Morning   ranolazine (RANEXA) 500 MG 12 hr tablet Take 1 tablet by mouth 2 (two) times daily.   04/15/2023   tamsulosin (FLOMAX) 0.4 MG CAPS capsule Take 1 capsule (0.4 mg total) by mouth daily after supper. 30 capsule 1 04/15/2023   aspirin EC 81 MG tablet Take 81 mg by mouth at bedtime. Swallow whole. (Patient not taking: Reported on 04/16/2023)   Not Taking   clopidogrel (PLAVIX) 75 MG tablet Take 75 mg by mouth at bedtime. (Patient not taking: Reported on 04/16/2023)   Not Taking   pantoprazole (PROTONIX) 40 MG tablet Take 40 mg by mouth at bedtime. (Patient not taking: Reported on 12/13/2019)   Not Taking    Assessment: Pharmacy consulted to dose heparin in this 89 year old male admitted with ACS/NSTEMI. On apixaban PTA - last dose unknown. CrCl = 28.5 ml/min  Date Time HL/aPTT Rate/Comment  0226 1809 aPTT >200 Supratherapeutic on 900 u/hr 0227    0447   >1.10, 70         Therapeutic X 1 0227 1238 -- / 51  Subtherapeutic on 700 u/hr 0227    2130      /  58              SUBtherapeutic @ 850 u/hr  Goal of  Therapy:  Heparin level 0.3-0.7 units/ml aPTT 66-102 seconds Monitor platelets by anticoagulation protocol: Yes   Plan:  2/27:  aPTT @ 2130 = 58, SUBtherapeutic - Will order heparin 1100 units IV X 1 bolus and increase drip rate to 1000 units/hr  - Will recheck aPTT and HL 8 hrs after rate change Monitor daily aPTT and heparin levels until correlating, then monitor heparin levels only Monitor CBC and signs/symptoms of bleeding(hgb continues to trend down, MD aware)  Thank you for involving pharmacy in this patient's care.   Amera Banos D Clinical Pharmacist 04/17/2023 10:49 PM

## 2023-04-18 DIAGNOSIS — I214 Non-ST elevation (NSTEMI) myocardial infarction: Secondary | ICD-10-CM | POA: Diagnosis not present

## 2023-04-18 DIAGNOSIS — J181 Lobar pneumonia, unspecified organism: Secondary | ICD-10-CM

## 2023-04-18 DIAGNOSIS — N179 Acute kidney failure, unspecified: Secondary | ICD-10-CM | POA: Diagnosis not present

## 2023-04-18 DIAGNOSIS — J439 Emphysema, unspecified: Secondary | ICD-10-CM | POA: Diagnosis not present

## 2023-04-18 DIAGNOSIS — I4819 Other persistent atrial fibrillation: Secondary | ICD-10-CM

## 2023-04-18 LAB — BASIC METABOLIC PANEL
Anion gap: 12 (ref 5–15)
BUN: 38 mg/dL — ABNORMAL HIGH (ref 8–23)
CO2: 19 mmol/L — ABNORMAL LOW (ref 22–32)
Calcium: 7.9 mg/dL — ABNORMAL LOW (ref 8.9–10.3)
Chloride: 102 mmol/L (ref 98–111)
Creatinine, Ser: 1.51 mg/dL — ABNORMAL HIGH (ref 0.61–1.24)
GFR, Estimated: 44 mL/min — ABNORMAL LOW (ref >60)
Glucose, Bld: 142 mg/dL — ABNORMAL HIGH (ref 70–99)
Potassium: 3.4 mmol/L — ABNORMAL LOW (ref 3.5–5.1)
Sodium: 133 mmol/L — ABNORMAL LOW (ref 135–145)

## 2023-04-18 LAB — CBC
HCT: 33.3 % — ABNORMAL LOW (ref 39.0–52.0)
Hemoglobin: 11.3 g/dL — ABNORMAL LOW (ref 13.0–17.0)
MCH: 31.8 pg (ref 26.0–34.0)
MCHC: 33.9 g/dL (ref 30.0–36.0)
MCV: 93.8 fL (ref 80.0–100.0)
Platelets: 181 10*3/uL (ref 150–400)
RBC: 3.55 MIL/uL — ABNORMAL LOW (ref 4.22–5.81)
RDW: 14.9 % (ref 11.5–15.5)
WBC: 6.4 10*3/uL (ref 4.0–10.5)
nRBC: 0 % (ref 0.0–0.2)

## 2023-04-18 LAB — HEPARIN LEVEL (UNFRACTIONATED): Heparin Unfractionated: >1.1 [IU]/mL — ABNORMAL HIGH (ref 0.30–0.70)

## 2023-04-18 LAB — APTT: aPTT: 80 s — ABNORMAL HIGH (ref 24–36)

## 2023-04-18 MED ORDER — METOPROLOL SUCCINATE ER 25 MG PO TB24
25.0000 mg | ORAL_TABLET | Freq: Every day | ORAL | Status: DC
  Administered 2023-04-18 – 2023-04-21 (×4): 25 mg via ORAL
  Filled 2023-04-18 (×4): qty 1

## 2023-04-18 MED ORDER — APIXABAN 2.5 MG PO TABS
2.5000 mg | ORAL_TABLET | Freq: Two times a day (BID) | ORAL | Status: DC
  Administered 2023-04-18 – 2023-04-19 (×3): 2.5 mg via ORAL
  Filled 2023-04-18 (×3): qty 1

## 2023-04-18 MED ORDER — POTASSIUM CHLORIDE CRYS ER 20 MEQ PO TBCR
20.0000 meq | EXTENDED_RELEASE_TABLET | Freq: Once | ORAL | Status: AC
  Administered 2023-04-18: 20 meq via ORAL
  Filled 2023-04-18: qty 1

## 2023-04-18 NOTE — Plan of Care (Signed)
   Problem: Education: Goal: Understanding of cardiac disease, CV risk reduction, and recovery process will improve Outcome: Progressing   Problem: Activity: Goal: Ability to tolerate increased activity will improve Outcome: Progressing   Problem: Cardiac: Goal: Ability to achieve and maintain adequate cardiovascular perfusion will improve Outcome: Progressing

## 2023-04-18 NOTE — Progress Notes (Signed)
 Progress Note   Patient: Jesus Stuart ZOX:[id number] DOB: [birth year] DOA: 04/16/2023     2 DOS: the patient was seen and examined on 04/18/2023   Brief hospital course: 89 y.o. male with medical history significant of CAD status post CABG, COPD, HFrEF, hypertension, atrial fibrillation presenting with NSTEMI, atrial fibrillation, syncope.  Patient reports having central chest pain for roughly 1 week or so.  Was seen by [facility] cardiology on February 11 with concern for chest pain.  Noted had new onset atrial fibrillation.  Started on Eliquis at that time.  Chest pressure has persisted.  Noted remote history of MI in the past.  Symptoms are not similar.  No shortness of breath.  No abdominal pain.  No nausea or vomiting.  No lower extremity swelling or orthopnea.  Has been compliant with home regimen.  Was initially evaluated as a code STEMI in the field with noted inferior ST elevations.  Was evaluated by Dr. Okey Dupre on arrival to the ER with resolution of ST elevations.  Code STEMI canceled.  Noted syncopal event associated with chest pain.  No report of focal hemiparesis or confusion. Presented to the ER afebrile, hemodynamically stable.  Satting upper 80s on room air.  Placed on 2L [place] to keep O2 sats >94%. White count 10.1, hemoglobin 12.7, platelets 201, troponin 259--> 4685.  Creatinine 1.72.  Lactate of 5.  BNP of 3152.  Chest x-ray with asymmetric edema versus pneumonia of the left lung, CT head stable, CT C-spine stable.  2/27.  With creatinine elevated 2.02, cardiac catheterization was canceled.  Patient on heparin drip.  Patient complains of shortness of breath and asking for his inhalers. 2/28.  Creatinine down to 1.51 today.  Assessment and Plan: * NSTEMI (non-ST elevated myocardial infarction) (HCC) Troponin greater than 24,000.  Continue aspirin, low-dose Toprol and Lipitor.  LDL 49.  Patient was on heparin drip for 2 days.   Acute kidney injury superimposed on CKD (HCC) AKI on CKD  stage IIIa.  Creatinine worsened up to 2.02 on 2/27.  Today's creatinine down to 1.51.  Baseline creatinine about 1.2.  Will discontinue IV fluids today  Lobar pneumonia (HCC) Started on Rocephin and Zithromax on 2/27.  COPD (chronic obstructive pulmonary disease) (HCC) Continue inhalers  Acute respiratory failure with hypoxia (HCC) 1 pulse ox of 89% on room air.  Now on room air  Acute on chronic heart failure with reduced ejection fraction (HFrEF, <= 40%) (HCC) Restarted on Toprol.  Other medications limited secondary to acute kidney injury and hypotension  Atrial fibrillation (HCC) Persistent in nature.  Cardiology placed back on Eliquis low-dose.   Essential hypertension Toprol-XL restarted  Syncope and collapse CT head WNL           Subjective: Patient still feels some tightness in the chest and feels short of breath with moving around.  Physical Exam: Vitals:   04/17/23 2345 04/18/23 0356 04/18/23 0825 04/18/23 1243  BP: 135/79 (!) 132/40 117/64 97/69  Pulse: 92 96 92 84  Resp: 20  16 16   Temp:  97.7 F (36.5 C) 97.8 F (36.6 C) (!) 97.5 F (36.4 C)  TempSrc:  Oral    SpO2: 97%  98% 98%  Weight:      Height:       Physical Exam HENT:     Head: Normocephalic.     Mouth/Throat:     Pharynx: No oropharyngeal exudate.  Eyes:     General: Lids are normal.  Conjunctiva/sclera: Conjunctivae normal.  Cardiovascular:     Rate and Rhythm: Normal rate and regular rhythm.     Heart sounds: Normal heart sounds, S1 normal and S2 normal.  Pulmonary:     Breath sounds: Examination of the right-lower field reveals decreased breath sounds. Examination of the left-lower field reveals decreased breath sounds. Decreased breath sounds present. No wheezing or rhonchi.  Abdominal:     Palpations: Abdomen is soft.     Tenderness: There is no abdominal tenderness.  Musculoskeletal:     Right lower leg: No swelling.     Left lower leg: No swelling.  Skin:    General:  Skin is warm.     Findings: No rash.  Neurological:     Mental Status: He is alert and oriented to person, place, and time.     Data Reviewed: Sodium 133, potassium 3.4, CO2 19, creatinine 1.51, white blood cell count 6.4, hemoglobin 11.3, platelet count 181 Family Communication: Spoke with son at the bedside  Disposition: Status is: Inpatient Remains inpatient appropriate because: Monitoring creatinine to see whether a cardiac catheterization can be done on Monday  Planned Discharge Destination: Home with Home Health    Time spent: 28 minutes  Author: Alford Highland, MD 04/18/2023 2:18 PM  For on call review [email or web address].

## 2023-04-18 NOTE — Assessment & Plan Note (Signed)
 Started on Rocephin and Zithromax on 2/27.

## 2023-04-18 NOTE — Progress Notes (Signed)
 [facility] CLINIC CARDIOLOGY PROGRESS NOTE       [name] MRN: [id number] DOB/AGE: [id number] 88 y.o.  Admit date: 04/16/2023 Referring Physician Dr. Pilar Jarvis Primary Physician Lauro Regulus, MD  Primary Cardiologist Dr. Juliann Pares Reason for Consultation NSTEMI  HPI: Jesus Stuart is a 89 y.o. male  with a past medical history of coronary artery disease s/p CABG with a LIMA to the LAD, SVG to ramus intermedius, SVG to OM2, and SVG to RCA in 1997, PCI of the mid to distal RCA with overlapping Resolute Onyx DES on 11/2019, ischemic cardiomyopathy, frequent PVCs, COPD, and hypertension who presented to the ED on 04/16/2023 for chest pain. Initially paged as code STEMI, this was cancelled by Dr. Okey Dupre. Cardiology was consulted for further evaluation.   Interval history: -Patient seen and examined this AM, sleeping in bed with son present at beside. States he slept very well last night.  -Cr improved today to 1.51.  -Denies any chest pain. Remains without SOB. -BP and HR remain stable.  Review of systems complete and found to be negative unless listed above    Past Medical History:  Diagnosis Date   Atrial fibrillation (HCC) 03/2023   BPH (benign prostatic hyperplasia)    COPD (chronic obstructive pulmonary disease) (HCC)    Coronary artery disease    HFrEF (heart failure with reduced ejection fraction) (HCC)    LVEF 25-30% by echo in 2022   Hypertension    MI (myocardial infarction) (HCC)    Reactive airway disease     Past Surgical History:  Procedure Laterality Date   CORONARY ARTERY BYPASS GRAFT     CORONARY STENT INTERVENTION N/A 12/08/2019   Procedure: CORONARY STENT INTERVENTION;  Surgeon: Alwyn Pea, MD;  Location: [facility] INVASIVE CV LAB;  Service: Cardiovascular;  Laterality: N/A;   LEFT HEART CATH AND CORONARY ANGIOGRAPHY Left 12/08/2019   Procedure: LEFT HEART CATH AND CORONARY ANGIOGRAPHY;  Surgeon: Alwyn Pea, MD;  Location:  [facility] INVASIVE CV LAB;  Service: Cardiovascular;  Laterality: Left;    Medications Prior to Admission  Medication Sig Dispense Refill Last Dose/Taking   albuterol (VENTOLIN HFA) 108 (90 Base) MCG/ACT inhaler Inhale 1-2 puffs into the lungs every 6 (six) hours as needed for wheezing or shortness of breath.   04/15/2023   apixaban (ELIQUIS) 5 MG TABS tablet Take 5 mg by mouth 2 (two) times daily.   04/15/2023   atorvastatin (LIPITOR) 40 MG tablet Take 1 tablet (40 mg total) by mouth daily. 30 tablet 11 04/15/2023   budesonide-formoterol (SYMBICORT) 160-4.5 MCG/ACT inhaler Inhale 2 puffs into the lungs 2 (two) times daily as needed (respiratory issues.).   Taking As Needed   colchicine 0.6 MG tablet Take 0.6 mg by mouth 2 (two) times daily. As needed   Taking   ENTRESTO 24-26 MG Take 1 tablet by mouth 2 (two) times daily.   04/15/2023   hydrochlorothiazide (HYDRODIURIL) 25 MG tablet Take 1 tablet by mouth daily.   04/15/2023   isosorbide mononitrate (IMDUR) 30 MG 24 hr tablet Take 1 tablet (30 mg total) by mouth every evening. 30 tablet 1 04/15/2023   metoprolol succinate (TOPROL-XL) 25 MG 24 hr tablet Take 1 tablet by mouth daily.   04/15/2023   nitroGLYCERIN (NITROSTAT) 0.4 MG SL tablet Place 0.4 mg under the tongue every 5 (five) minutes x 3 doses as needed for chest pain.   04/16/2023 Morning   ranolazine (RANEXA) 500 MG 12 hr tablet Take 1  tablet by mouth 2 (two) times daily.   04/15/2023   tamsulosin (FLOMAX) 0.4 MG CAPS capsule Take 1 capsule (0.4 mg total) by mouth daily after supper. 30 capsule 1 04/15/2023   aspirin EC 81 MG tablet Take 81 mg by mouth at bedtime. Swallow whole. (Patient not taking: Reported on 04/16/2023)   Not Taking   clopidogrel (PLAVIX) 75 MG tablet Take 75 mg by mouth at bedtime. (Patient not taking: Reported on 04/16/2023)   Not Taking   pantoprazole (PROTONIX) 40 MG tablet Take 40 mg by mouth at bedtime. (Patient not taking: Reported on 12/13/2019)   Not Taking   Social History    Socioeconomic History   Marital status: Married    Spouse name: Not on file   Number of children: Not on file   Years of education: Not on file   Highest education level: Not on file  Occupational History   Not on file  Tobacco Use   Smoking status: Former   Smokeless tobacco: Never   Tobacco comments:    quit 75yrs ago  Vaping Use   Vaping status: Never Used  Substance and Sexual Activity   Alcohol use: Never   Drug use: Never   Sexual activity: Never  Other Topics Concern   Not on file  Social History Narrative   Not on file   Social Drivers of Health   Financial Resource Strain: Low Risk  (03/13/2023)   Received from [facility]   Overall Financial Resource Strain (CARDIA)    Difficulty of Paying Living Expenses: Not hard at all  Food Insecurity: No Food Insecurity (04/16/2023)   Hunger Vital Sign    Worried About Running Out of Food in the Last Year: Never true    Ran Out of Food in the Last Year: Never true  Transportation Needs: No Transportation Needs (04/16/2023)   PRAPARE - Administrator, Civil Service (Medical): No    Lack of Transportation (Non-Medical): No  Physical Activity: Not on file  Stress: Not on file  Social Connections: Socially Integrated (04/16/2023)   Social Connection and Isolation Panel [NHANES]    Frequency of Communication with Friends and Family: Three times a week    Frequency of Social Gatherings with Friends and Family: Three times a week    Attends Religious Services: More than 4 times per year    Active Member of Clubs or Organizations: No    Attends Banker Meetings: More than 4 times per year    Marital Status: Married  Catering manager Violence: Not At Risk (04/16/2023)   Humiliation, Afraid, Rape, and Kick questionnaire    Fear of Current or Ex-Partner: No    Emotionally Abused: No    Physically Abused: No    Sexually Abused: No    Family History  Problem Relation Age of Onset    Stroke Mother      Vitals:   04/17/23 1555 04/17/23 2025 04/17/23 2345 04/18/23 0356  BP: 110/62 108/72 135/79 (!) 132/40  Pulse: 93 94 92 96  Resp:  19 20   Temp: 97.8 F (36.6 C) 98.1 F (36.7 C)  97.7 F (36.5 C)  TempSrc: Oral   Oral  SpO2: 97% 96% 97%   Weight:      Height:        PHYSICAL EXAM General: Chronically ill appearing elderly male, well nourished, in no acute distress. HEENT: Normocephalic and atraumatic. Neck: No JVD.  Lungs: Normal respiratory effort on  RA. Clear bilaterally to auscultation. No wheezes, crackles, rhonchi.  Heart: Irregularly irregular. Normal S1 and S2 without gallops or murmurs.  Abdomen: Non-distended appearing.  Msk: Normal strength and tone for age. Extremities: Warm and well perfused. No clubbing, cyanosis. No edema.  Neuro: Alert and oriented X 3. Psych: Answers questions appropriately.   Labs: Basic Metabolic Panel: Recent Labs    04/17/23 0447 04/18/23 0710  NA 133* 133*  K 3.9 3.4*  CL 103 102  CO2 19* 19*  GLUCOSE 114* 142*  BUN 36* 38*  CREATININE 2.02* 1.51*  CALCIUM 8.1* 7.9*   Liver Function Tests: Recent Labs    04/16/23 0421 04/17/23 0447  AST 32 122*  ALT 19 35  ALKPHOS 50 40  BILITOT 2.4* 1.7*  PROT 6.1* 5.7*  ALBUMIN 3.6 3.3*   No results for input(s): "LIPASE", "AMYLASE" in the last 72 hours. CBC: Recent Labs    04/16/23 0421 04/17/23 0447 04/18/23 0710  WBC 10.1 8.2 6.4  NEUTROABS 9.1* 6.4  --   HGB 12.7* 12.2* 11.3*  HCT 38.2* 35.7* 33.3*  MCV 97.2 93.9 93.8  PLT 201 179 181   Cardiac Enzymes: Recent Labs    04/16/23 0421 04/16/23 0613 04/16/23 1134  TROPONINIHS 259* 4,685* >24,000*   BNP: Recent Labs    04/16/23 0421  BNP 3,152.8*   D-Dimer: No results for input(s): "DDIMER" in the last 72 hours. Hemoglobin A1C: Recent Labs    04/16/23 0421  HGBA1C 5.7*   Fasting Lipid Panel: Recent Labs    04/16/23 0421  CHOL 111  HDL 47  LDLCALC 49  TRIG 74  CHOLHDL 2.4    Thyroid Function Tests: No results for input(s): "TSH", "T4TOTAL", "T3FREE", "THYROIDAB" in the last 72 hours.  Invalid input(s): "FREET3" Anemia Panel: No results for input(s): "VITAMINB12", "FOLATE", "FERRITIN", "TIBC", "IRON", "RETICCTPCT" in the last 72 hours.   Radiology: [facility] Chest Port 1 View Result Date: 04/17/2023 CLINICAL DATA:  Wheezing EXAM: PORTABLE CHEST 1 VIEW COMPARISON:  04/16/2023 FINDINGS: Postoperative changes in the mediastinum. Cardiac enlargement. No vascular congestion. Left perihilar infiltrate, likely pneumonia or possibly asymmetric edema. This appears similar to prior study although previous patches obscured visualization to this area on the prior study. No pleural effusion or pneumothorax. Mediastinal contours appear intact. Calcification of the aorta. Degenerative changes in the shoulders. IMPRESSION: Persistent finding of left perihilar infiltration. Cardiac enlargement. Electronically Signed   By: Burman Nieves M.D.   On: 04/17/2023 15:36   ECHOCARDIOGRAM COMPLETE Result Date: 04/17/2023    ECHOCARDIOGRAM REPORT   Patient Name:   Jesus Stuart Date of Exam: 04/16/2023 Medical Rec #:  [id number]         Height:       69.0 in Accession #:    [id number]        Weight:       161.2 lb Date of Birth:  April 07, 1930        BSA:          1.885 m Patient Age:    89 years          BP:           112/64 mmHg Patient Gender: M                 HR:           83 bpm. Exam Location:  [facility] Procedure: 2D Echo, Cardiac Doppler, Color Doppler and Intracardiac  Opacification Agent (Both Spectral and Color Flow Doppler were            utilized during procedure). Indications:     Atrial Fibrillation  History:         Patient has no prior history of Echocardiogram examinations.                  Prior CABG, COPD, Signs/Symptoms:Syncope; Risk                  Factors:Hypertension. NSTEMI.  Sonographer:     Mikki Harbor Referring Phys:  [phone] Francoise Schaumann [place] Diagnosing Phys:  Marcina Millard MD  Sonographer Comments: Image acquisition challenging due to COPD. IMPRESSIONS  1. Left ventricular ejection fraction, by estimation, is 35 to 40%. The left ventricle has moderately decreased function. The left ventricle demonstrates regional wall motion abnormalities (see scoring diagram/findings for description). Indeterminate diastolic filling due to E-A fusion.  2. Right ventricular systolic function is normal. The right ventricular size is normal. There is moderately elevated pulmonary artery systolic pressure.  3. The mitral valve is normal in structure. Moderate mitral valve regurgitation. No evidence of mitral stenosis.  4. The aortic valve is normal in structure. Aortic valve regurgitation is moderate. No aortic stenosis is present.  5. The inferior vena cava is normal in size with greater than 50% respiratory variability, suggesting right atrial pressure of 3 mmHg. FINDINGS  Left Ventricle: Left ventricular ejection fraction, by estimation, is 35 to 40%. The left ventricle has moderately decreased function. The left ventricle demonstrates regional wall motion abnormalities. Definity contrast agent was given IV to delineate the left ventricular endocardial borders. Strain imaging was not performed. The left ventricular internal cavity size was normal in size. There is no left ventricular hypertrophy. Indeterminate diastolic filling due to E-A fusion.  LV Wall Scoring: The apical lateral segment, apical septal segment, and apex are akinetic. The mid inferoseptal segment is hypokinetic. Right Ventricle: The right ventricular size is normal. No increase in right ventricular wall thickness. Right ventricular systolic function is normal. There is moderately elevated pulmonary artery systolic pressure. The tricuspid regurgitant velocity is 3.26 m/s, and with an assumed right atrial pressure of 8 mmHg, the estimated right ventricular systolic pressure is 50.5 mmHg. Left Atrium: Left atrial  size was normal in size. Right Atrium: Right atrial size was normal in size. Pericardium: There is no evidence of pericardial effusion. Mitral Valve: The mitral valve is normal in structure. Moderate mitral valve regurgitation. No evidence of mitral valve stenosis. MV peak gradient, 6.2 mmHg. The mean mitral valve gradient is 2.0 mmHg. Tricuspid Valve: The tricuspid valve is normal in structure. Tricuspid valve regurgitation is mild . No evidence of tricuspid stenosis. Aortic Valve: The aortic valve is normal in structure. Aortic valve regurgitation is moderate. No aortic stenosis is present. Aortic valve mean gradient measures 2.7 mmHg. Aortic valve peak gradient measures 5.6 mmHg. Aortic valve area, by VTI measures 3.03 cm. Pulmonic Valve: The pulmonic valve was normal in structure. Pulmonic valve regurgitation is not visualized. No evidence of pulmonic stenosis. Aorta: The aortic root is normal in size and structure. Venous: The inferior vena cava is normal in size with greater than 50% respiratory variability, suggesting right atrial pressure of 3 mmHg. IAS/Shunts: No atrial level shunt detected by color flow Doppler. Additional Comments: 3D imaging was not performed.  LEFT VENTRICLE PLAX 2D LVIDd:         6.30 cm LVIDs:  4.65 cm LV PW:         1.30 cm LV IVS:        1.20 cm LVOT diam:     2.00 cm LV SV:         52 LV SV Index:   28 LVOT Area:     3.14 cm  RIGHT VENTRICLE RV Basal diam:  4.15 cm RV Mid diam:    4.30 cm LEFT ATRIUM              Index        RIGHT ATRIUM           Index LA diam:        5.40 cm  2.86 cm/m   RA Area:     26.10 cm LA Vol (A2C):   106.0 ml 56.24 ml/m  RA Volume:   91.40 ml  48.49 ml/m LA Vol (A4C):   106.0 ml 56.24 ml/m LA Biplane Vol: 106.0 ml 56.24 ml/m  AORTIC VALVE                    PULMONIC VALVE AV Area (Vmax):    2.96 cm     PV Vmax:       0.72 m/s AV Area (Vmean):   2.76 cm     PV Peak grad:  2.1 mmHg AV Area (VTI):     3.03 cm AV Vmax:           118.00 cm/s  AV Vmean:          73.167 cm/s AV VTI:            0.173 m AV Peak Grad:      5.6 mmHg AV Mean Grad:      2.7 mmHg LVOT Vmax:         111.00 cm/s LVOT Vmean:        64.300 cm/s LVOT VTI:          0.167 m LVOT/AV VTI ratio: 0.97  AORTA Ao Root diam: 3.90 cm MITRAL VALVE                TRICUSPID VALVE MV Area (PHT): 4.49 cm     TR Peak grad:   42.5 mmHg MV Area VTI:   1.62 cm     TR Vmax:        326.00 cm/s MV Peak grad:  6.2 mmHg MV Mean grad:  2.0 mmHg     SHUNTS MV Vmax:       1.25 m/s     Systemic VTI:  0.17 m MV Vmean:      55.3 cm/s    Systemic Diam: 2.00 cm MV Decel Time: 169 msec MV E velocity: 106.00 cm/s Marcina Millard MD Electronically signed by Marcina Millard MD Signature Date/Time: 04/17/2023/1:07:11 PM    Final    CT CHEST WO CONTRAST Result Date: 04/16/2023 CLINICAL DATA:  Chest pain.  Syncope. EXAM: CT CHEST WITHOUT CONTRAST TECHNIQUE: Multidetector CT imaging of the chest was performed following the standard protocol without IV contrast. RADIATION DOSE REDUCTION: This exam was performed according to the departmental dose-optimization program which includes automated exposure control, adjustment of the mA and/or kV according to patient size and/or use of iterative reconstruction technique. COMPARISON:  Chest radiograph dated 04/16/2023. FINDINGS: Cardiovascular: The heart is enlarged. Prior CABG. Coronary artery calcifications. Nonaneurysmal aorta. Atherosclerotic calcifications of the thoracic aorta and arch branch vessels. Mediastinum/Nodes: There are several prominent and a few mildly enlarged mediastinal  lymph nodes including a 11 mm right paratracheal lymph node and a 12 mm subcarinal lymph node. No enlarged axillary lymph nodes. Trachea and esophagus are unremarkable. Lungs/Pleura: Patchy ground-glass and consolidative opacities with a central peribronchovascular distribution throughout the left lung, most pronounced in the left upper lobe. Less pronounced patchy ground-glass  opacities with a predominantly central peribronchovascular distribution in the right lung. Bilateral left-greater-than-right interlobular septal thickening. Trace left pleural effusion. No pneumothorax. Upper Abdomen: Trace upper abdominal ascites. Partially visualized 7 cm cyst arising from the superior pole of the left kidney. Musculoskeletal: No chest wall mass or suspicious bone lesions identified. Prior median sternotomy with discontinuity of multiple cerclage wires again noted. IMPRESSION: 1. Patchy ground-glass and consolidative opacities with a central peribronchovascular distribution throughout the left lung and less pronounced patchy opacities within the right lung with interlobular septal thickening and trace left pleural effusion. These findings are favored to represent cardiogenic pulmonary edema, although, the presence of an underlying infiltrate can not be excluded. 2. Mildly enlarged and prominent mediastinal lymph nodes are likely reactive. 3. Cardiomegaly. 4. Trace upper abdominal ascites. 5. Aortic Atherosclerosis (ICD10-I70.0). Electronically Signed   By: Hart Robinsons M.D.   On: 04/16/2023 09:34   DG Chest Port 1 View Result Date: 04/16/2023 CLINICAL DATA:  Awoke with chest pain EXAM: PORTABLE CHEST 1 VIEW COMPARISON:  12/13/2019 FINDINGS: Stable borderline heart size.  Prior CABG. Asymmetric interstitial and airspace opacity to the left lung which is significantly obscured by overlapping hardware. No visible pleural effusion or pneumothorax. IMPRESSION: Asymmetric edema versus pneumonia in the left lung. Electronically Signed   By: Tiburcio Pea M.D.   On: 04/16/2023 05:41   CT Head Wo Contrast Result Date: 04/16/2023 CLINICAL DATA:  Nausea and neck trauma. EXAM: CT HEAD WITHOUT CONTRAST CT CERVICAL SPINE WITHOUT CONTRAST TECHNIQUE: Multidetector CT imaging of the head and cervical spine was performed following the standard protocol without intravenous contrast. Multiplanar CT image  reconstructions of the cervical spine were also generated. RADIATION DOSE REDUCTION: This exam was performed according to the departmental dose-optimization program which includes automated exposure control, adjustment of the mA and/or kV according to patient size and/or use of iterative reconstruction technique. COMPARISON:  None Available. FINDINGS: CT HEAD FINDINGS Brain: No evidence of acute infarction, hemorrhage, hydrocephalus, extra-axial collection or mass lesion/mass effect. Chronic small vessel disease in the cerebral white matter that is mild. 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 Vascular: No hyperdense vessel or unexpected calcification. Skull: Normal. Negative for fracture or focal lesion. Sinuses/Orbits: No evidence of injury. CT CERVICAL SPINE FINDINGS Alignment: No traumatic malalignment Skull base and vertebrae: No acute fracture.  Subjective osteopenia Soft tissues and spinal canal: No prevertebral fluid or swelling. No visible canal hematoma. Disc levels: Generalized degenerative endplate and facet spurring in keeping with age. Upper chest: Partially covered airspace disease at the left apex. IMPRESSION: 1. No evidence of acute intracranial or cervical spine injury. 2. Partially covered left apical opacity, correlate with pending chest radiograph. Electronically Signed   By: Tiburcio Pea M.D.   On: 04/16/2023 05:18   CT Cervical Spine Wo Contrast Result Date: 04/16/2023 CLINICAL DATA:  Nausea and neck trauma. EXAM: CT HEAD WITHOUT CONTRAST CT CERVICAL SPINE WITHOUT CONTRAST TECHNIQUE: Multidetector CT imaging of the head and cervical spine was performed following the standard protocol without intravenous contrast. Multiplanar CT image reconstructions of the cervical spine were also generated. RADIATION DOSE REDUCTION: This exam was performed according to the departmental dose-optimization program which includes automated  exposure control, adjustment of the mA and/or kV  according to patient size and/or use of iterative reconstruction technique. COMPARISON:  None Available. FINDINGS: CT HEAD FINDINGS Brain: No evidence of acute infarction, hemorrhage, hydrocephalus, extra-axial collection or mass lesion/mass effect. Chronic small vessel disease in the cerebral white matter that is mild. 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 Vascular: No hyperdense vessel or unexpected calcification. Skull: Normal. Negative for fracture or focal lesion. Sinuses/Orbits: No evidence of injury. CT CERVICAL SPINE FINDINGS Alignment: No traumatic malalignment Skull base and vertebrae: No acute fracture.  Subjective osteopenia Soft tissues and spinal canal: No prevertebral fluid or swelling. No visible canal hematoma. Disc levels: Generalized degenerative endplate and facet spurring in keeping with age. Upper chest: Partially covered airspace disease at the left apex. IMPRESSION: 1. No evidence of acute intracranial or cervical spine injury. 2. Partially covered left apical opacity, correlate with pending chest radiograph. Electronically Signed   By: Tiburcio Pea M.D.   On: 04/16/2023 05:18    ECHO as above  TELEMETRY reviewed by me 04/18/2023: atrial fibrillation RBBB PVCs rate 80-90s  EKG reviewed by me: AF RBBB rate 89 bpm, minimal STE inferior leads  Data reviewed by me 04/18/2023: last 24h vitals tele labs imaging I/O hospitalist progress note  Principal Problem:   NSTEMI (non-ST elevated myocardial infarction) (HCC) Active Problems:   COPD (chronic obstructive pulmonary disease) (HCC)   Essential hypertension   Acute kidney injury superimposed on CKD (HCC)   Atrial fibrillation (HCC)   Syncope and collapse   Acute respiratory failure with hypoxia (HCC)   Acute on chronic heart failure with reduced ejection fraction (HFrEF, <= 40%) (HCC)    ASSESSMENT AND PLAN:  Jesus Stuart is a 89 y.o. male  with a past medical history of coronary artery disease s/p CABG  with a LIMA to the LAD, SVG to ramus intermedius, SVG to OM2, and SVG to RCA in 1997, PCI of the mid to distal RCA with overlapping Resolute Onyx DES on 11/2019, ischemic cardiomyopathy, frequent PVCs, COPD, and hypertension who presented to the ED on 04/16/2023 for chest pain. Initially paged as code STEMI, this was cancelled by Dr. Okey Dupre. Cardiology was consulted for further evaluation.   # NSTEMI # Coronary artery disease s/p CABG 1997 # Acute on chronic HFrEF Patient presented with significant chest pain episode last night, initially code STEMI was called for inferior ST elevation however this was canceled as this had improved on repeat EKG in the ED.  Troponin trended 259 > 4685 > 24000. Echo with EF 35-40%, apical akinesis and inferoseptal hypokinesis. -Discontinue heparin.  -S/p ASA 325 with EMS.  Continue aspirin 81 mg daily and atorvastatin 40 mg daily.  He had been taking aspirin and Plavix until earlier this month when these were discontinued and he was started on Eliquis for atrial fibrillation. -Continue metoprolol 25 mg daily. Home Entresto held at this time given borderline BP and renal function.  Plan to resume as able. -Will tenatively plan for [facility] Monday for further evaluation pending renal function and patient symptoms.   # Persistent atrial fibrillation Patient with history of atrial fibrillation.  In atrial fibrillation with right bundle branch block on EKG in the ED.  Rate overall controlled. -Resume eliquis, likely hold Sunday in anticipation for [facility] Monday. -Metoprolol as above  This patient's plan of care was discussed and created with Dr. Darrold Junker and he is in agreement.  Signed: Gale Journey, PA-C  04/18/2023, 8:23 AM [facility] Cardiology

## 2023-04-19 DIAGNOSIS — I214 Non-ST elevation (NSTEMI) myocardial infarction: Secondary | ICD-10-CM | POA: Diagnosis not present

## 2023-04-19 DIAGNOSIS — J181 Lobar pneumonia, unspecified organism: Secondary | ICD-10-CM | POA: Diagnosis not present

## 2023-04-19 DIAGNOSIS — J449 Chronic obstructive pulmonary disease, unspecified: Secondary | ICD-10-CM | POA: Diagnosis not present

## 2023-04-19 DIAGNOSIS — N179 Acute kidney failure, unspecified: Secondary | ICD-10-CM | POA: Diagnosis not present

## 2023-04-19 DIAGNOSIS — I4821 Permanent atrial fibrillation: Secondary | ICD-10-CM

## 2023-04-19 LAB — BASIC METABOLIC PANEL
Anion gap: 10 (ref 5–15)
BUN: 36 mg/dL — ABNORMAL HIGH (ref 8–23)
CO2: 21 mmol/L — ABNORMAL LOW (ref 22–32)
Calcium: 8.1 mg/dL — ABNORMAL LOW (ref 8.9–10.3)
Chloride: 104 mmol/L (ref 98–111)
Creatinine, Ser: 1.27 mg/dL — ABNORMAL HIGH (ref 0.61–1.24)
GFR, Estimated: 54 mL/min — ABNORMAL LOW (ref >60)
Glucose, Bld: 121 mg/dL — ABNORMAL HIGH (ref 70–99)
Potassium: 3.3 mmol/L — ABNORMAL LOW (ref 3.5–5.1)
Sodium: 135 mmol/L (ref 135–145)

## 2023-04-19 LAB — CBC
HCT: 33.1 % — ABNORMAL LOW (ref 39.0–52.0)
Hemoglobin: 11.1 g/dL — ABNORMAL LOW (ref 13.0–17.0)
MCH: 32 pg (ref 26.0–34.0)
MCHC: 33.5 g/dL (ref 30.0–36.0)
MCV: 95.4 fL (ref 80.0–100.0)
Platelets: 192 10*3/uL (ref 150–400)
RBC: 3.47 MIL/uL — ABNORMAL LOW (ref 4.22–5.81)
RDW: 14.8 % (ref 11.5–15.5)
WBC: 6.9 10*3/uL (ref 4.0–10.5)
nRBC: 0 % (ref 0.0–0.2)

## 2023-04-19 LAB — MAGNESIUM: Magnesium: 2.3 mg/dL (ref 1.7–2.4)

## 2023-04-19 LAB — LIPOPROTEIN A (LPA): Lipoprotein (a): 9 nmol/L (ref <75.0)

## 2023-04-19 MED ORDER — POTASSIUM CHLORIDE 20 MEQ PO PACK
40.0000 meq | PACK | Freq: Once | ORAL | Status: AC
  Administered 2023-04-19: 40 meq via ORAL
  Filled 2023-04-19: qty 2

## 2023-04-19 NOTE — Plan of Care (Signed)
  Problem: Education: Goal: Understanding of cardiac disease, CV risk reduction, and recovery process will improve Outcome: Progressing Goal: Individualized Educational Video(s) Outcome: Progressing   Problem: Activity: Goal: Ability to tolerate increased activity will improve Outcome: Progressing   Problem: Cardiac: Goal: Ability to achieve and maintain adequate cardiovascular perfusion will improve Outcome: Progressing   Problem: Education: Goal: Knowledge of General Education information will improve Description: Including pain rating scale, medication(s)/side effects and non-pharmacologic comfort measures Outcome: Progressing   Problem: Clinical Measurements: Goal: Will remain free from infection Outcome: Progressing Goal: Diagnostic test results will improve Outcome: Progressing

## 2023-04-19 NOTE — Progress Notes (Signed)
 Progress Note   Patient: Jesus Stuart ZOX:[id number] DOB: 03/29/1930 DOA: 04/16/2023     3 DOS: the patient was seen and examined on 04/19/2023   Brief hospital course: 89 y.o. male with medical history significant of CAD status post CABG, COPD, HFrEF, hypertension, atrial fibrillation presenting with NSTEMI, atrial fibrillation, syncope.  Patient reports having central chest pain for roughly 1 week or so.  Was seen by [facility] cardiology on February 11 with concern for chest pain.  Noted had new onset atrial fibrillation.  Started on Eliquis at that time.  Chest pressure has persisted.  Noted remote history of MI in the past.  Symptoms are not similar.  No shortness of breath.  No abdominal pain.  No nausea or vomiting.  No lower extremity swelling or orthopnea.  Has been compliant with home regimen.  Was initially evaluated as a code STEMI in the field with noted inferior ST elevations.  Was evaluated by Dr. Okey Dupre on arrival to the ER with resolution of ST elevations.  Code STEMI canceled.  Noted syncopal event associated with chest pain.  No report of focal hemiparesis or confusion. Presented to the ER afebrile, hemodynamically stable.  Satting upper 80s on room air.  Placed on 2L [place] to keep O2 sats >94%. White count 10.1, hemoglobin 12.7, platelets 201, troponin 259--> 4685.  Creatinine 1.72.  Lactate of 5.  BNP of 3152.  Chest x-ray with asymmetric edema versus pneumonia of the left lung, CT head stable, CT C-spine stable.  2/27.  With creatinine elevated 2.02, cardiac catheterization was canceled.  Patient on heparin drip.  Patient complains of shortness of breath and asking for his inhalers. 2/28.  Creatinine down to 1.51 today. 3/1: Creatinine now at baseline, 1.27 today.  Potassium 3.3 with magnesium of 2.3.  Cardiac cath is being scheduled for Monday.  Eliquis is being held from today's p.m. dose as recommended by cardiology  Assessment and Plan: * NSTEMI (non-ST elevated myocardial infarction)  (HCC) Troponin greater than 24,000.  Continue aspirin, low-dose Toprol and Lipitor.  LDL 49.  Patient was on heparin drip for 2 days. -Cardiac cath on Monday   Acute kidney injury superimposed on CKD (HCC) AKI on CKD stage IIIa.  Creatinine worsened up to 2.02 on 2/27.  Continue to improve and now at baseline of 1.27 baseline creatinine about 1.2-1.4.   -Monitor renal function -Avoid nephrotoxin  Lobar pneumonia (HCC) Started on Rocephin and Zithromax on 2/27.  COPD (chronic obstructive pulmonary disease) (HCC) Continue inhalers  Acute respiratory failure with hypoxia (HCC) 1 pulse ox of 89% on room air.  Now on room air  Acute on chronic heart failure with reduced ejection fraction (HFrEF, <= 40%) (HCC) Restarted on Toprol.  Other medications limited secondary to acute kidney injury and hypotension  Atrial fibrillation (HCC) Persistent in nature.  Cardiology placed back on Eliquis low-dose. -Holding Eliquis from 3/1 PM for cath on 3/3 as recommended by cardiology   Essential hypertension Toprol-XL restarted  Syncope and collapse CT head WNL      Subjective: Patient was sitting in chair when seen today.  No chest pain but continue to have exertional dyspnea.  Physical Exam: Vitals:   04/19/23 0017 04/19/23 0505 04/19/23 0931 04/19/23 1143  BP: 101/63 104/69 112/71 107/77  Pulse: 81 82 82 87  Resp: (!) 22 (!) 24  20  Temp: 98 F (36.7 C) 97.6 F (36.4 C) 97.7 F (36.5 C)   TempSrc: Oral Oral Oral   SpO2: 97%  98% 100% 99%  Weight:      Height:       General.  Frail elderly man, in no acute distress. Pulmonary.  Lungs clear bilaterally, normal respiratory effort. CV.  Regular rate and rhythm, no JVD, rub or murmur. Abdomen.  Soft, nontender, nondistended, BS positive. CNS.  Alert and oriented .  No focal neurologic deficit. Extremities.  No edema, no cyanosis, pulses intact and symmetrical.   Data Reviewed: Prior data reviewed  Family Communication:  Discussed with the family member at bedside  Disposition: Status is: Inpatient Remains inpatient appropriate because: Monitoring creatinine to see whether a cardiac catheterization can be done on Monday  Planned Discharge Destination: Home with Home Health  Time spent: 50 minutes  This record has been created using Conservation officer, historic buildings. Errors have been sought and corrected,but may not always be located. Such creation errors do not reflect on the standard of care.   Author: Arnetha Courser, MD 04/19/2023 1:46 PM  For on call review [email or web address].

## 2023-04-19 NOTE — Progress Notes (Signed)
 Mobility Specialist - Progress Note   04/19/23 0917  Mobility  Activity Ambulated with assistance in hallway;Stood at bedside;Dangled on edge of bed  Level of Assistance Contact guard assist, steadying assist  Assistive Device Front wheel walker  Distance Ambulated (ft) 100 ft  Activity Response Tolerated well  Mobility Referral Yes  Mobility visit 1 Mobility  Mobility Specialist Start Time (ACUTE ONLY) [id number]  Mobility Specialist Stop Time (ACUTE ONLY) 0842  Mobility Specialist Time Calculation (min) (ACUTE ONLY) 14 min   Pt supine in bed on RA upon arrival. Pt completes bed mobility and able to maintain balance EOB indep. Pt ambulates in hallaway CGA. Pt left in recliner with needs in reach and family present.   Terrilyn Saver  Mobility Specialist  04/19/23 9:19 AM

## 2023-04-19 NOTE — Care Management Important Message (Signed)
 Important Message  Patient Details  Name: Jesus Stuart MRN: [id number] Date of Birth: 1930/08/02   Important Message Given:  Yes - Medicare IM     Cristela Blue, CMA 04/19/2023, 9:41 AM

## 2023-04-19 NOTE — Progress Notes (Signed)
 [facility] Cardiology  SUBJECTIVE: Patient laying in bed, denies chest pain or shortness of breath   Vitals:   04/18/23 1615 04/18/23 2022 04/19/23 0017 04/19/23 0505  BP: 117/79 113/69 101/63 104/69  Pulse: 85 92 81 82  Resp:  (!) 24 (!) 22 (!) 24  Temp: 97.9 F (36.6 C) 98 F (36.7 C) 98 F (36.7 C) 97.6 F (36.4 C)  TempSrc: Oral  Oral Oral  SpO2: 99% 97% 97% 98%  Weight:      Height:         Intake/Output Summary (Last 24 hours) at 04/19/2023 0856 Last data filed at 04/19/2023 0505 Gross per 24 hour  Intake 760 ml  Output 275 ml  Net 485 ml      PHYSICAL EXAM  General: Well developed, well nourished, in no acute distress HEENT:  Normocephalic and atramatic Neck:  No JVD.  Lungs: Clear bilaterally to auscultation and percussion. Heart: HRRR . Normal S1 and S2 without gallops or murmurs.  Abdomen: Bowel sounds are positive, abdomen soft and non-tender  Msk:  Back normal, normal gait. Normal strength and tone for age. Extremities: No clubbing, cyanosis or edema.   Neuro: Alert and oriented X 3. Psych:  Good affect, responds appropriately   LABS: Basic Metabolic Panel: Recent Labs    04/18/23 0710 04/19/23 0454  NA 133* 135  K 3.4* 3.3*  CL 102 104  CO2 19* 21*  GLUCOSE 142* 121*  BUN 38* 36*  CREATININE 1.51* 1.27*  CALCIUM 7.9* 8.1*   Liver Function Tests: Recent Labs    04/17/23 0447  AST 122*  ALT 35  ALKPHOS 40  BILITOT 1.7*  PROT 5.7*  ALBUMIN 3.3*   No results for input(s): "LIPASE", "AMYLASE" in the last 72 hours. CBC: Recent Labs    04/17/23 0447 04/18/23 0710 04/19/23 0454  WBC 8.2 6.4 6.9  NEUTROABS 6.4  --   --   HGB 12.2* 11.3* 11.1*  HCT 35.7* 33.3* 33.1*  MCV 93.9 93.8 95.4  PLT 179 181 192   Cardiac Enzymes: No results for input(s): "CKTOTAL", "CKMB", "CKMBINDEX", "TROPONINI" in the last 72 hours. BNP: Invalid input(s): "POCBNP" D-Dimer: No results for input(s): "DDIMER" in the last 72 hours. Hemoglobin A1C: No results  for input(s): "HGBA1C" in the last 72 hours. Fasting Lipid Panel: No results for input(s): "CHOL", "HDL", "LDLCALC", "TRIG", "CHOLHDL", "LDLDIRECT" in the last 72 hours. Thyroid Function Tests: No results for input(s): "TSH", "T4TOTAL", "T3FREE", "THYROIDAB" in the last 72 hours.  Invalid input(s): "FREET3" Anemia Panel: No results for input(s): "VITAMINB12", "FOLATE", "FERRITIN", "TIBC", "IRON", "RETICCTPCT" in the last 72 hours.  DG Chest Port 1 View Result Date: 04/17/2023 CLINICAL DATA:  Wheezing EXAM: PORTABLE CHEST 1 VIEW COMPARISON:  04/16/2023 FINDINGS: Postoperative changes in the mediastinum. Cardiac enlargement. No vascular congestion. Left perihilar infiltrate, likely pneumonia or possibly asymmetric edema. This appears similar to prior study although previous patches obscured visualization to this area on the prior study. No pleural effusion or pneumothorax. Mediastinal contours appear intact. Calcification of the aorta. Degenerative changes in the shoulders. IMPRESSION: Persistent finding of left perihilar infiltration. Cardiac enlargement. Electronically Signed   By: Burman Nieves M.D.   On: 04/17/2023 15:36     Echo EF 35-40% 04/16/2023  TELEMETRY: Atrial fibrillation at 94 bpm:  ASSESSMENT AND PLAN:  Principal Problem:   NSTEMI (non-ST elevated myocardial infarction) (HCC) Active Problems:   COPD (chronic obstructive pulmonary disease) (HCC)   Essential hypertension   Acute kidney injury superimposed on  CKD (HCC)   Atrial fibrillation (HCC)   Syncope and collapse   Acute respiratory failure with hypoxia (HCC)   Acute on chronic heart failure with reduced ejection fraction (HFrEF, <= 40%) (HCC)   Lobar pneumonia (HCC)    1.  NSTEMI, (259, 4625, greater than 24,000), probable inferior STEMI with nondiagnostic ST elevation, chest pain-free, cardiac catheterization initially scheduled for 04/17/2023 delayed due to acute kidney injury, renal function now improving 2.   Acute on chronic HFrEF, on metoprolol succinate, Entresto on hold due to low blood pressure 3.  Chronic atrial fibrillation, on Eliquis for stroke prevention  Recommendations  1.  Agree with current therapy 2.  Restart home Entresto as blood pressure permits 3.  Hold Eliquis starting p.m. dose 04/19/2023 4.  Cardiac catheterization with selective coronary arteriography scheduled for 04/21/2023.  The risk, benefits alternatives of cardiac catheterization and possible PCI were explained to the patient and informed consent was obtained.   Jesus Millard, MD, PhD, [facility] 04/19/2023 8:56 AM

## 2023-04-20 DIAGNOSIS — I214 Non-ST elevation (NSTEMI) myocardial infarction: Secondary | ICD-10-CM | POA: Diagnosis not present

## 2023-04-20 NOTE — Progress Notes (Signed)
 PROGRESS NOTE    Jesus Stuart  ZOX:[id number]  DOB: January 09, 1931  DOA: 04/16/2023 PCP: Lauro Regulus, MD Outpatient Specialists:   Hospital course:  89 y.o. male with medical history significant of CAD status post CABG, COPD, HFrEF, hypertension, atrial fibrillation presenting with NSTEMI, atrial fibrillation, syncope.  Patient reports having central chest pain for roughly 1 week or so.  Was seen by [facility] cardiology on February 11 with concern for chest pain.  Noted had new onset atrial fibrillation.  Started on Eliquis at that time.  Chest pressure has persisted.  Noted remote history of MI in the past.  Symptoms are not similar.  No shortness of breath.  No abdominal pain.  No nausea or vomiting.  No lower extremity swelling or orthopnea.  Has been compliant with home regimen.  Was initially evaluated as a code STEMI in the field with noted inferior ST elevations.  Was evaluated by Dr. Okey Dupre on arrival to the ER with resolution of ST elevations.  Code STEMI canceled.  Noted syncopal event associated with chest pain.  No report of focal hemiparesis or confusion. Presented to the ER afebrile, hemodynamically stable.  Satting upper 80s on room air.  Placed on 2L [place] to keep O2 sats >94%. White count 10.1, hemoglobin 12.7, platelets 201, troponin 259--> 4685.  Creatinine 1.72.  Lactate of 5.  BNP of 3152.  Chest x-ray with asymmetric edema versus pneumonia of the left lung, CT head stable, CT C-spine stable.   2/27.  With creatinine elevated 2.02, cardiac catheterization was canceled.  Patient on heparin drip.  Patient complains of shortness of breath and asking for his inhalers. 2/28.  Creatinine down to 1.51 today. 3/1: Creatinine now at baseline, 1.27 today.  Potassium 3.3 with magnesium of 2.3.  Cardiac cath is being scheduled for Monday.  Eliquis is being held from today's p.m. dose as recommended by cardiology  3/2: Patient seen with his son.  He says he is doing okay, no further  chest discomfort.  Is walking back and forth to the bathroom without difficulty.  States he is scheduled for cath tomorrow.    Objective: Vitals:   04/20/23 0528 04/20/23 0736 04/20/23 1149 04/20/23 1613  BP: 133/74 (!) 134/92 123/75 123/84  Pulse: 88 (!) 103 91 88  Resp: 20 20 (!) 24 (!) 27  Temp: 97.9 F (36.6 C) (!) 97.5 F (36.4 C) 98.1 F (36.7 C) 97.6 F (36.4 C)  TempSrc:  Oral Oral Oral  SpO2: 99% 100% 100% 100%  Weight:      Height:        Intake/Output Summary (Last 24 hours) at 04/20/2023 1702 Last data filed at 04/20/2023 1500 Gross per 24 hour  Intake 684.03 ml  Output --  Net 684.03 ml   Filed Weights   04/16/23 0410 04/16/23 1923  Weight: 73.1 kg 72.4 kg     Exam:  General: Patient lying in bed with son at bedside in NAD Eyes: sclera anicteric, conjuctiva mild injection bilaterally CVS: S1-S2, regular  Respiratory:  decreased air entry bilaterally secondary to decreased inspiratory effort, rales at bases  GI: NABS, soft, NT  LE: Warm and well-perfused Neuro: A/O x 3,  grossly nonfocal.  Psych: patient is logical and coherent, judgement and insight appear normal, mood and affect appropriate to situation.  Data Reviewed:  Basic Metabolic Panel: Recent Labs  Lab 04/16/23 0421 04/17/23 0447 04/18/23 0710 04/19/23 0454  NA 135 133* 133* 135  K 3.9 3.9 3.4* 3.3*  CL 102 103 102 104  CO2 20* 19* 19* 21*  GLUCOSE 273* 114* 142* 121*  BUN 21 36* 38* 36*  CREATININE 1.72* 2.02* 1.51* 1.27*  CALCIUM 8.4* 8.1* 7.9* 8.1*  MG  --   --   --  2.3    CBC: Recent Labs  Lab 04/16/23 0421 04/17/23 0447 04/18/23 0710 04/19/23 0454  WBC 10.1 8.2 6.4 6.9  NEUTROABS 9.1* 6.4  --   --   HGB 12.7* 12.2* 11.3* 11.1*  HCT 38.2* 35.7* 33.3* 33.1*  MCV 97.2 93.9 93.8 95.4  PLT 201 179 181 192     Scheduled Meds:  aspirin EC  81 mg Oral Daily   atorvastatin  40 mg Oral Daily   azithromycin  250 mg Oral Daily   metoprolol succinate  25 mg Oral Daily    mometasone-formoterol  2 puff Inhalation BID   Continuous Infusions:  cefTRIAXone (ROCEPHIN)  IV 2 g (04/19/23 1826)     Assessment & Plan:   NSTEMI with peak 24,000 Thought to be likely inferior STEMI with nondiagnostic ST elevation per cardiology Dr. Darrold Junker Patient chest pain-free Off of heparin, on ASA, metoprolol and atorvastatin Plan is for catheterization in the morning, Eliquis being held  CAP Improving on ceftriaxone and azithromycin day #4 today  AKI on CKD 3 AA Improving, down to baseline at 1.3 today Will need to follow closely after cath/dilated  HFrEF Euvolemic at present Entresto being held due to low BP Continue Toprol-XL  Atrial fibrillation Rate is controlled on metoprolol XL Eliquis being held for cath tomorrow  HTN Controlled on Toprol-XL      DVT prophylaxis: Eliquis-presently being held for 24 hours for cath Code Status: DNR Family Communication: Son was at bedside throughout     Studies: No results found.  Principal Problem:   NSTEMI (non-ST elevated myocardial infarction) (HCC) Active Problems:   Acute kidney injury superimposed on CKD (HCC)   COPD (chronic obstructive pulmonary disease) (HCC)   Lobar pneumonia (HCC)   Acute respiratory failure with hypoxia (HCC)   Acute on chronic heart failure with reduced ejection fraction (HFrEF, <= 40%) (HCC)   Atrial fibrillation (HCC)   Essential hypertension   Syncope and collapse     Jesus Stuart Jesus Stuart, [facility]  If 7PM-7AM, please contact night-coverage [email or web address]   LOS: 4 days

## 2023-04-20 NOTE — Progress Notes (Signed)
 [facility] Cardiology  SUBJECTIVE: Patient laying in bed, denies chest pain   Vitals:   04/19/23 1841 04/20/23 0009 04/20/23 0528 04/20/23 0736  BP: 119/77 127/84 133/74 (!) 134/92  Pulse: 93 94 88 (!) 103  Resp: 18 (!) 22 20 20   Temp: (!) 97.5 F (36.4 C) (!) 97 F (36.1 C) 97.9 F (36.6 C) (!) 97.5 F (36.4 C)  TempSrc:    Oral  SpO2: 99% 99% 99% 100%  Weight:      Height:         Intake/Output Summary (Last 24 hours) at 04/20/2023 6962 Last data filed at 04/20/2023 0300 Gross per 24 hour  Intake 444.03 ml  Output --  Net 444.03 ml      PHYSICAL EXAM  General: Well developed, well nourished, in no acute distress HEENT:  Normocephalic and atramatic Neck:  No JVD.  Lungs: Clear bilaterally to auscultation and percussion. Heart: HRRR . Normal S1 and S2 without gallops or murmurs.  Abdomen: Bowel sounds are positive, abdomen soft and non-tender  Msk:  Back normal, normal gait. Normal strength and tone for age. Extremities: No clubbing, cyanosis or edema.   Neuro: Alert and oriented X 3. Psych:  Good affect, responds appropriately   LABS: Basic Metabolic Panel: Recent Labs    04/18/23 0710 04/19/23 0454  NA 133* 135  K 3.4* 3.3*  CL 102 104  CO2 19* 21*  GLUCOSE 142* 121*  BUN 38* 36*  CREATININE 1.51* 1.27*  CALCIUM 7.9* 8.1*  MG  --  2.3   Liver Function Tests: No results for input(s): "AST", "ALT", "ALKPHOS", "BILITOT", "PROT", "ALBUMIN" in the last 72 hours. No results for input(s): "LIPASE", "AMYLASE" in the last 72 hours. CBC: Recent Labs    04/18/23 0710 04/19/23 0454  WBC 6.4 6.9  HGB 11.3* 11.1*  HCT 33.3* 33.1*  MCV 93.8 95.4  PLT 181 192   Cardiac Enzymes: No results for input(s): "CKTOTAL", "CKMB", "CKMBINDEX", "TROPONINI" in the last 72 hours. BNP: Invalid input(s): "POCBNP" D-Dimer: No results for input(s): "DDIMER" in the last 72 hours. Hemoglobin A1C: No results for input(s): "HGBA1C" in the last 72 hours. Fasting Lipid Panel: No  results for input(s): "CHOL", "HDL", "LDLCALC", "TRIG", "CHOLHDL", "LDLDIRECT" in the last 72 hours. Thyroid Function Tests: No results for input(s): "TSH", "T4TOTAL", "T3FREE", "THYROIDAB" in the last 72 hours.  Invalid input(s): "FREET3" Anemia Panel: No results for input(s): "VITAMINB12", "FOLATE", "FERRITIN", "TIBC", "IRON", "RETICCTPCT" in the last 72 hours.  No results found.   Echo EF 35-40% 04/16/2023  TELEMETRY: A-fib 84 bpm:  ASSESSMENT AND PLAN:  Principal Problem:   NSTEMI (non-ST elevated myocardial infarction) (HCC) Active Problems:   COPD (chronic obstructive pulmonary disease) (HCC)   Essential hypertension   Acute kidney injury superimposed on CKD (HCC)   Atrial fibrillation (HCC)   Syncope and collapse   Acute respiratory failure with hypoxia (HCC)   Acute on chronic heart failure with reduced ejection fraction (HFrEF, <= 40%) (HCC)   Lobar pneumonia (HCC)    1.  NSTEMI, (259, 4625, greater than 24,000), probable inferior STEMI with nondiagnostic ST elevation, chest pain-free, cardiac catheterization initially scheduled for 04/17/2023 delayed due to acute kidney injury, renal function now improving 2.  Acute on chronic HFrEF, on metoprolol succinate, Entresto on hold due to low blood pressure 3.  Chronic atrial fibrillation, on Eliquis for stroke prevention, held for cardiac catheterization 4.  AKI, with underlying CKD stage IIIa,  BUN and creatinine 36 and 1.27, respectively, improved  from 36 and 2.02 on 04/17/2023   Recommendations   1.  Agree with current therapy 2.  Restart home Entresto as blood pressure permits 3.  Hold Eliquis starting p.m. dose 04/19/2023 4.  Cardiac catheterization with selective coronary arteriography scheduled for 04/21/2023.  The risk, benefits alternatives of cardiac catheterization and possible PCI were explained to the patient and informed consent was obtained.   Marcina Millard, MD, PhD, [facility] 04/20/2023 9:29 AM

## 2023-04-20 NOTE — Progress Notes (Signed)
 Mobility Specialist - Progress Note   04/20/23 1352  Mobility  Activity Stood at bedside;Dangled on edge of bed  Level of Assistance Moderate assist, patient does 50-74%  Assistive Device Front wheel walker  Distance Ambulated (ft) 0 ft  Activity Response Tolerated well  Mobility Referral Yes  Mobility visit 1 Mobility  Mobility Specialist Start Time (ACUTE ONLY) 1318  Mobility Specialist Stop Time (ACUTE ONLY) 1342  Mobility Specialist Time Calculation (min) (ACUTE ONLY) 24 min   Pt supine in bed on RA upon arrival. Pt completes bed mobility HHA + extra time. Pt STS ModA and able to maintain standing balance CGA for 15~20 seconds. Pt does endorse left foot pain and defers further mobility. RN notified. Pt left in bed with needs in reach and bed alarm activated.   Terrilyn Saver  Mobility Specialist  04/20/23 1:54 PM

## 2023-04-20 NOTE — Plan of Care (Signed)

## 2023-04-21 ENCOUNTER — Other Ambulatory Visit (HOSPITAL_COMMUNITY): Payer: Self-pay

## 2023-04-21 ENCOUNTER — Telehealth (HOSPITAL_COMMUNITY): Payer: Self-pay | Admitting: Pharmacy Technician

## 2023-04-21 ENCOUNTER — Encounter
Admission: EM | Disposition: A | Discharge status: Home Health | Payer: Self-pay | Source: Home / Self Care | Attending: Internal Medicine

## 2023-04-21 ENCOUNTER — Encounter: Payer: Self-pay | Admitting: Cardiology

## 2023-04-21 DIAGNOSIS — M79671 Pain in right foot: Secondary | ICD-10-CM

## 2023-04-21 DIAGNOSIS — N179 Acute kidney failure, unspecified: Secondary | ICD-10-CM | POA: Diagnosis not present

## 2023-04-21 DIAGNOSIS — I214 Non-ST elevation (NSTEMI) myocardial infarction: Secondary | ICD-10-CM | POA: Diagnosis not present

## 2023-04-21 DIAGNOSIS — J181 Lobar pneumonia, unspecified organism: Secondary | ICD-10-CM | POA: Diagnosis not present

## 2023-04-21 DIAGNOSIS — J449 Chronic obstructive pulmonary disease, unspecified: Secondary | ICD-10-CM | POA: Diagnosis not present

## 2023-04-21 HISTORY — PX: LEFT HEART CATH AND CORS/GRAFTS ANGIOGRAPHY: CATH118250

## 2023-04-21 SURGERY — LEFT HEART CATH AND CORS/GRAFTS ANGIOGRAPHY
Anesthesia: Moderate Sedation

## 2023-04-21 MED ORDER — HEPARIN SODIUM (PORCINE) 1000 UNIT/ML IJ SOLN
INTRAMUSCULAR | Status: DC | PRN
  Administered 2023-04-21: 3500 [IU] via INTRAVENOUS

## 2023-04-21 MED ORDER — TRAMADOL HCL 50 MG PO TABS: 50.0000 mg | ORAL_TABLET | Freq: Four times a day (QID) | ORAL | Status: DC | PRN

## 2023-04-21 MED ORDER — SODIUM CHLORIDE 0.9 % WEIGHT BASED INFUSION
3.0000 mL/kg/h | INTRAVENOUS | Status: AC
  Administered 2023-04-21: 3 mL/kg/h via INTRAVENOUS

## 2023-04-21 MED ORDER — MIDAZOLAM HCL 2 MG/2ML IJ SOLN
INTRAMUSCULAR | Status: DC | PRN
  Administered 2023-04-21: .5 mg via INTRAVENOUS

## 2023-04-21 MED ORDER — LIDOCAINE HCL (PF) 1 % IJ SOLN
INTRAMUSCULAR | Status: DC | PRN
  Administered 2023-04-21: 2 mL

## 2023-04-21 MED ORDER — SODIUM CHLORIDE 0.9% FLUSH
3.0000 mL | Freq: Two times a day (BID) | INTRAVENOUS | Status: DC
  Administered 2023-04-21 – 2023-04-22 (×2): 3 mL via INTRAVENOUS

## 2023-04-21 MED ORDER — LABETALOL HCL 5 MG/ML IV SOLN: 10.0000 mg | INTRAVENOUS | Status: AC | PRN

## 2023-04-21 MED ORDER — SODIUM CHLORIDE 0.9% FLUSH: 3.0000 mL | INTRAVENOUS | Status: DC | PRN

## 2023-04-21 MED ORDER — GABAPENTIN 100 MG PO CAPS
100.0000 mg | ORAL_CAPSULE | Freq: Two times a day (BID) | ORAL | Status: DC
  Administered 2023-04-21 – 2023-04-22 (×3): 100 mg via ORAL
  Filled 2023-04-21 (×3): qty 1

## 2023-04-21 MED ORDER — APIXABAN 5 MG PO TABS
5.0000 mg | ORAL_TABLET | Freq: Two times a day (BID) | ORAL | Status: DC
  Administered 2023-04-21 – 2023-04-22 (×2): 5 mg via ORAL
  Filled 2023-04-21 (×2): qty 1

## 2023-04-21 MED ORDER — HEPARIN SODIUM (PORCINE) 1000 UNIT/ML IJ SOLN
INTRAMUSCULAR | Status: AC
  Filled 2023-04-21: qty 10

## 2023-04-21 MED ORDER — SODIUM CHLORIDE 0.9 % WEIGHT BASED INFUSION
1.0000 mL/kg/h | INTRAVENOUS | Status: AC
  Administered 2023-04-21: 1 mL/kg/h via INTRAVENOUS

## 2023-04-21 MED ORDER — HEPARIN (PORCINE) IN NACL 1000-0.9 UT/500ML-% IV SOLN
INTRAVENOUS | Status: AC
  Filled 2023-04-21: qty 1000

## 2023-04-21 MED ORDER — ASPIRIN 81 MG PO CHEW
CHEWABLE_TABLET | ORAL | Status: AC
  Filled 2023-04-21: qty 1

## 2023-04-21 MED ORDER — SODIUM CHLORIDE 0.9 % WEIGHT BASED INFUSION: 1.0000 mL/kg/h | INTRAVENOUS | Status: DC

## 2023-04-21 MED ORDER — SODIUM CHLORIDE 0.9 % IV SOLN: 250.0000 mL | INTRAVENOUS | Status: DC | PRN

## 2023-04-21 MED ORDER — VERAPAMIL HCL 2.5 MG/ML IV SOLN
INTRAVENOUS | Status: AC
  Filled 2023-04-21: qty 2

## 2023-04-21 MED ORDER — HEPARIN (PORCINE) IN NACL 2000-0.9 UNIT/L-% IV SOLN
INTRAVENOUS | Status: DC | PRN
  Administered 2023-04-21: 1000 mL

## 2023-04-21 MED ORDER — FENTANYL CITRATE (PF) 100 MCG/2ML IJ SOLN
INTRAMUSCULAR | Status: DC | PRN
  Administered 2023-04-21: 12.5 ug via INTRAVENOUS

## 2023-04-21 MED ORDER — IOHEXOL 300 MG/ML  SOLN
INTRAMUSCULAR | Status: DC | PRN
  Administered 2023-04-21: 102 mL

## 2023-04-21 MED ORDER — FENTANYL CITRATE (PF) 100 MCG/2ML IJ SOLN
INTRAMUSCULAR | Status: AC
  Filled 2023-04-21: qty 2

## 2023-04-21 MED ORDER — HYDRALAZINE HCL 20 MG/ML IJ SOLN: 10.0000 mg | INTRAMUSCULAR | Status: AC | PRN

## 2023-04-21 MED ORDER — VERAPAMIL HCL 2.5 MG/ML IV SOLN
INTRAVENOUS | Status: DC | PRN
  Administered 2023-04-21: 2.5 mg via INTRA_ARTERIAL

## 2023-04-21 MED ORDER — ASPIRIN 81 MG PO CHEW
81.0000 mg | CHEWABLE_TABLET | ORAL | Status: AC
  Administered 2023-04-21: 81 mg via ORAL

## 2023-04-21 MED ORDER — MIDAZOLAM HCL 2 MG/2ML IJ SOLN
INTRAMUSCULAR | Status: AC
  Filled 2023-04-21: qty 2

## 2023-04-21 MED ORDER — SODIUM CHLORIDE 0.9% FLUSH: 3.0000 mL | Freq: Two times a day (BID) | INTRAVENOUS | Status: DC

## 2023-04-21 MED ORDER — IPRATROPIUM-ALBUTEROL 0.5-2.5 (3) MG/3ML IN SOLN
3.0000 mL | Freq: Once | RESPIRATORY_TRACT | Status: AC
  Administered 2023-04-21: 3 mL via RESPIRATORY_TRACT
  Filled 2023-04-21: qty 3

## 2023-04-21 MED ORDER — SODIUM CHLORIDE 0.9% FLUSH
3.0000 mL | Freq: Two times a day (BID) | INTRAVENOUS | Status: DC
  Administered 2023-04-22: 3 mL via INTRAVENOUS

## 2023-04-21 SURGICAL SUPPLY — 11 items
CATH INFINITI 5FR MULTPACK ANG (CATHETERS) IMPLANT
DEVICE RAD TR BAND REGULAR (VASCULAR PRODUCTS) IMPLANT
DRAPE BRACHIAL (DRAPES) IMPLANT
GLIDESHEATH SLEND SS 6F .021 (SHEATH) IMPLANT
GUIDEWIRE INQWIRE 1.5J.035X260 (WIRE) IMPLANT
INQWIRE 1.5J .035X260CM (WIRE) ×1 IMPLANT
KIT SYRINGE INJ CVI SPIKEX1 (MISCELLANEOUS) IMPLANT
PACK CARDIAC CATH (CUSTOM PROCEDURE TRAY) ×1 IMPLANT
PROTECTION STATION PRESSURIZED (MISCELLANEOUS) ×1 IMPLANT
SET ATX-X65L (MISCELLANEOUS) IMPLANT
STATION PROTECTION PRESSURIZED (MISCELLANEOUS) IMPLANT

## 2023-04-21 NOTE — Progress Notes (Signed)
 No hematoma on left radial.  Patient short of breath with exertion.  Vitals stable.  Patient complaining of pain in feet but MD ordered gabapentin and tramadol.  Given gabapentin and patient sleeping at this time.

## 2023-04-21 NOTE — Telephone Encounter (Addendum)
 Patient Product/process development scientist completed.    The patient is insured through [employer]. Patient has Medicare and is not eligible for a copay card, but may be able to apply for patient assistance or Medicare RX Payment Plan (Patient Must reach out to their plan, if eligible for payment plan), if available.    Ran test claim for Eliquis 5 mg and the current 30 day co-pay is $114.04.  Ran test claim for Xarelto 20 mg and the current 30 day co-pay is $112.50   This test claim was processed through [employer]- copay amounts may vary at other pharmacies due to [employer], or as the patient moves through the different stages of their insurance plan.     Roland Earl, CPHT Pharmacy Technician III Certified Patient Advocate [facility] Patient Advocate Team Direct Number: [phone]  Fax: [phone]

## 2023-04-21 NOTE — Progress Notes (Signed)
 Progress Note   Patient: Jesus Stuart:[id number] DOB: 28-Mar-1930 DOA: 04/16/2023     5 DOS: the patient was seen and examined on 04/21/2023   Brief hospital course: 89 y.o. male with medical history significant of CAD status post CABG, COPD, HFrEF, hypertension, atrial fibrillation presenting with NSTEMI, atrial fibrillation, syncope.  Patient reports having central chest pain for roughly 1 week or so.  Was seen by [facility] cardiology on February 11 with concern for chest pain.  Noted had new onset atrial fibrillation.  Started on Eliquis at that time.  Chest pressure has persisted.  Noted remote history of MI in the past.  Symptoms are not similar.  No shortness of breath.  No abdominal pain.  No nausea or vomiting.  No lower extremity swelling or orthopnea.  Has been compliant with home regimen.  Was initially evaluated as a code STEMI in the field with noted inferior ST elevations.  Was evaluated by Dr. Okey Dupre on arrival to the ER with resolution of ST elevations.  Code STEMI canceled.  Noted syncopal event associated with chest pain.  No report of focal hemiparesis or confusion. Presented to the ER afebrile, hemodynamically stable.  Satting upper 80s on room air.  Placed on 2L [place] to keep O2 sats >94%. White count 10.1, hemoglobin 12.7, platelets 201, troponin 259--> 4685.  Creatinine 1.72.  Lactate of 5.  BNP of 3152.  Chest x-ray with asymmetric edema versus pneumonia of the left lung, CT head stable, CT C-spine stable.  2/27.  With creatinine elevated 2.02, cardiac catheterization was canceled.  Patient on heparin drip.  Patient complains of shortness of breath and asking for his inhalers. 2/28.  Creatinine down to 1.51 today. 3/1: Creatinine now at baseline, 1.27 today.  Potassium 3.3 with magnesium of 2.3.  Cardiac cath is being scheduled for Monday.  Eliquis is being held from today's p.m. dose as recommended by cardiology. 3/3: Had cardiac cath today Has occluded SVG to RCA and 99% stenosis of  SVG to small caliber OM2. Intervention deferred given age of graft and also concern of renal function.  They are recommending medical management.  Patient will resume Eliquis from tomorrow.  Developed new onset bilateral feet pain, it was sharp and patient was unable to stand.  Starting on gabapentin and tramadol as needed, patient would like to stay another night as he is unable to walk at this time due to this pain  Assessment and Plan: * NSTEMI (non-ST elevated myocardial infarction) (HCC) Troponin greater than 24,000.  Continue aspirin, low-dose Toprol and Lipitor.  LDL 49.  Patient was on heparin drip for 2 days. -Cardiac cath today with occluded SVG to RCA and 99% stenosis of SVG to small caliber OM2. Intervention deferred given age of graft and concern of renal function.  They are recommending medical management. -Continue current management  Acute kidney injury superimposed on CKD (HCC) AKI on CKD stage IIIa.  Creatinine worsened up to 2.02 on 2/27.  Continue to improve and now at baseline of 1.27 baseline creatinine about 1.2-1.4.   -Monitor renal function -Avoid nephrotoxin  Lobar pneumonia (HCC) Started on Rocephin and Zithromax on 2/27.  COPD (chronic obstructive pulmonary disease) (HCC) Continue inhalers  Acute respiratory failure with hypoxia (HCC) 1 pulse ox of 89% on room air.  Now on room air  Acute on chronic heart failure with reduced ejection fraction (HFrEF, <= 40%) (HCC) Restarted on Toprol.  Other medications limited secondary to acute kidney injury and hypotension  Foot  pain, bilateral Patient developed new onset bilateral foot pain, pretty tender to touch and unable to walk.  He described it as sharp pain.  No burning.  No significant erythema. -Ordered some gabapentin and tramadol -Continue to monitor  Atrial fibrillation (HCC) Persistent in nature.  Cardiology placed back on Eliquis low-dose. -Holding Eliquis from 3/1 PM for cath on 3/3 as recommended by  cardiology   Essential hypertension Toprol-XL restarted  Syncope and collapse CT head WNL      Subjective: Patient was seen after the cardiac cath today.  He was complaining of bilateral foot pain, described as sharp and unable to walk on it.  Physical Exam: Vitals:   04/21/23 0945 04/21/23 1000 04/21/23 1015 04/21/23 1047  BP: (!) 135/91 (!) 131/90 (!) 120/90 115/87  Pulse: 87 (!) 101 89 92  Resp: (!) 27 (!) 25 (!) 28 (!) 22  Temp:    98 F (36.7 C)  TempSrc:      SpO2: 98% 100% 99% 100%  Weight:      Height:       General.  Frail elderly man, in no acute distress. Pulmonary.  Lungs clear bilaterally, normal respiratory effort. CV.  Regular rate and rhythm, no JVD, rub or murmur. Abdomen.  Soft, nontender, nondistended, BS positive. CNS.  Alert and oriented .  No focal neurologic deficit. Extremities.  No edema, no cyanosis, no erythema, 1+ pedal pulses bilaterally   Data Reviewed: Prior data reviewed  Family Communication: Discussed with daughter at bedside  Disposition: Status is: Inpatient Remains inpatient appropriate because: Monitoring creatinine to see whether a cardiac catheterization can be done on Monday  Planned Discharge Destination: Home with Home Health  Time spent: 50 minutes  This record has been created using Conservation officer, historic buildings. Errors have been sought and corrected,but may not always be located. Such creation errors do not reflect on the standard of care.   Author: Arnetha Courser, MD 04/21/2023 1:51 PM  For on call review [email or web address].

## 2023-04-21 NOTE — Plan of Care (Signed)
  Problem: Clinical Measurements: Goal: Will remain free from infection Outcome: Progressing Goal: Respiratory complications will improve Outcome: Progressing   Problem: Coping: Goal: Level of anxiety will decrease Outcome: Progressing   Problem: Elimination: Goal: Will not experience complications related to bowel motility Outcome: Progressing   Problem: Activity: Goal: Risk for activity intolerance will decrease Outcome: Not Progressing   Problem: Nutrition: Goal: Adequate nutrition will be maintained Outcome: Not Progressing

## 2023-04-21 NOTE — Assessment & Plan Note (Signed)
 Patient developed new onset bilateral foot pain, pretty tender to touch and unable to walk.  He described it as sharp pain.  No burning.  No significant erythema. -Ordered some gabapentin and tramadol -Continue to monitor

## 2023-04-21 NOTE — Progress Notes (Signed)
 [facility] CLINIC CARDIOLOGY PROGRESS NOTE       [name] MRN: [id number] DOB/AGE: [birth year] 88 y.o.  Admit date: 04/16/2023 Referring Physician Dr. Pilar Jarvis Primary Physician Lauro Regulus, MD  Primary Cardiologist Dr. Juliann Pares Reason for Consultation NSTEMI  HPI: Jesus Stuart is a 89 y.o. male  with a past medical history of coronary artery disease s/p CABG with a LIMA to the LAD, SVG to ramus intermedius, SVG to OM2, and SVG to RCA in 1997, PCI of the mid to distal RCA with overlapping Resolute Onyx DES on 11/2019, ischemic cardiomyopathy, frequent PVCs, COPD, and hypertension who presented to the ED on 04/16/2023 for chest pain. Initially paged as code STEMI, this was cancelled by Dr. Okey Dupre. Cardiology was consulted for further evaluation.   Interval history: -Patient seen and examined this AM following LHC. Denies any chest pain or SOB.  -No issues with bleeding after LHC.  -BP and HR remain stable.  Review of systems complete and found to be negative unless listed above    Past Medical History:  Diagnosis Date   Atrial fibrillation (HCC) 03/2023   BPH (benign prostatic hyperplasia)    COPD (chronic obstructive pulmonary disease) (HCC)    Coronary artery disease    HFrEF (heart failure with reduced ejection fraction) (HCC)    LVEF 25-30% by echo in 2022   Hypertension    MI (myocardial infarction) (HCC)    Reactive airway disease     Past Surgical History:  Procedure Laterality Date   CORONARY ARTERY BYPASS GRAFT     CORONARY STENT INTERVENTION N/A 12/08/2019   Procedure: CORONARY STENT INTERVENTION;  Surgeon: Alwyn Pea, MD;  Location: [facility] INVASIVE CV LAB;  Service: Cardiovascular;  Laterality: N/A;   LEFT HEART CATH AND CORONARY ANGIOGRAPHY Left 12/08/2019   Procedure: LEFT HEART CATH AND CORONARY ANGIOGRAPHY;  Surgeon: Alwyn Pea, MD;  Location: [facility] INVASIVE CV LAB;  Service: Cardiovascular;  Laterality: Left;     Medications Prior to Admission  Medication Sig Dispense Refill Last Dose/Taking   albuterol (VENTOLIN HFA) 108 (90 Base) MCG/ACT inhaler Inhale 1-2 puffs into the lungs every 6 (six) hours as needed for wheezing or shortness of breath.   04/15/2023   apixaban (ELIQUIS) 5 MG TABS tablet Take 5 mg by mouth 2 (two) times daily.   04/15/2023   atorvastatin (LIPITOR) 40 MG tablet Take 1 tablet (40 mg total) by mouth daily. 30 tablet 11 04/15/2023   budesonide-formoterol (SYMBICORT) 160-4.5 MCG/ACT inhaler Inhale 2 puffs into the lungs 2 (two) times daily as needed (respiratory issues.).   Taking As Needed   colchicine 0.6 MG tablet Take 0.6 mg by mouth 2 (two) times daily. As needed   Taking   ENTRESTO 24-26 MG Take 1 tablet by mouth 2 (two) times daily.   04/15/2023   hydrochlorothiazide (HYDRODIURIL) 25 MG tablet Take 1 tablet by mouth daily.   04/15/2023   isosorbide mononitrate (IMDUR) 30 MG 24 hr tablet Take 1 tablet (30 mg total) by mouth every evening. 30 tablet 1 04/15/2023   metoprolol succinate (TOPROL-XL) 25 MG 24 hr tablet Take 1 tablet by mouth daily.   04/15/2023   nitroGLYCERIN (NITROSTAT) 0.4 MG SL tablet Place 0.4 mg under the tongue every 5 (five) minutes x 3 doses as needed for chest pain.   04/16/2023 Morning   ranolazine (RANEXA) 500 MG 12 hr tablet Take 1 tablet by mouth 2 (two) times daily.   04/15/2023   tamsulosin (  FLOMAX) 0.4 MG CAPS capsule Take 1 capsule (0.4 mg total) by mouth daily after supper. 30 capsule 1 04/15/2023   aspirin EC 81 MG tablet Take 81 mg by mouth at bedtime. Swallow whole. (Patient not taking: Reported on 04/16/2023)   Not Taking   clopidogrel (PLAVIX) 75 MG tablet Take 75 mg by mouth at bedtime. (Patient not taking: Reported on 04/16/2023)   Not Taking   pantoprazole (PROTONIX) 40 MG tablet Take 40 mg by mouth at bedtime. (Patient not taking: Reported on 12/13/2019)   Not Taking   Social History   Socioeconomic History   Marital status: Married    Spouse name:  Not on file   Number of children: Not on file   Years of education: Not on file   Highest education level: Not on file  Occupational History   Not on file  Tobacco Use   Smoking status: Former   Smokeless tobacco: Never   Tobacco comments:    quit 68yrs ago  Vaping Use   Vaping status: Never Used  Substance and Sexual Activity   Alcohol use: Never   Drug use: Never   Sexual activity: Never  Other Topics Concern   Not on file  Social History Narrative   Not on file   Social Drivers of Health   Financial Resource Strain: Low Risk  (03/13/2023)   Received from [facility]   Overall Financial Resource Strain (CARDIA)    Difficulty of Paying Living Expenses: Not hard at all  Food Insecurity: No Food Insecurity (04/16/2023)   Hunger Vital Sign    Worried About Running Out of Food in the Last Year: Never true    Ran Out of Food in the Last Year: Never true  Transportation Needs: No Transportation Needs (04/16/2023)   PRAPARE - Administrator, Civil Service (Medical): No    Lack of Transportation (Non-Medical): No  Physical Activity: Not on file  Stress: Not on file  Social Connections: Socially Integrated (04/16/2023)   Social Connection and Isolation Panel [NHANES]    Frequency of Communication with Friends and Family: Three times a week    Frequency of Social Gatherings with Friends and Family: Three times a week    Attends Religious Services: More than 4 times per year    Active Member of Clubs or Organizations: No    Attends Banker Meetings: More than 4 times per year    Marital Status: Married  Catering manager Violence: Not At Risk (04/16/2023)   Humiliation, Afraid, Rape, and Kick questionnaire    Fear of Current or Ex-Partner: No    Emotionally Abused: No    Physically Abused: No    Sexually Abused: No    Family History  Problem Relation Age of Onset   Stroke Mother      Vitals:   04/21/23 0900 04/21/23 0915 04/21/23  0930 04/21/23 0945  BP: 127/82 129/83 (!) 123/90 (!) 135/91  Pulse: 88 91 92 87  Resp: (!) 24 (!) 28 (!) 27 (!) 27  Temp:      TempSrc:      SpO2: 99% 98% 99% 98%  Weight:      Height:        PHYSICAL EXAM General: Chronically ill appearing elderly male, well nourished, in no acute distress. HEENT: Normocephalic and atraumatic. Neck: No JVD.  Lungs: Normal respiratory effort on RA. Clear bilaterally to auscultation. No wheezes, crackles, rhonchi.  Heart: Irregularly irregular. Normal S1 and S2  without gallops or murmurs.  Abdomen: Non-distended appearing.  Msk: Normal strength and tone for age. Extremities: Warm and well perfused. No clubbing, cyanosis. No edema.  Neuro: Alert and oriented X 3. Psych: Answers questions appropriately.   Labs: Basic Metabolic Panel: Recent Labs    04/19/23 0454  NA 135  K 3.3*  CL 104  CO2 21*  GLUCOSE 121*  BUN 36*  CREATININE 1.27*  CALCIUM 8.1*  MG 2.3   Liver Function Tests: No results for input(s): "AST", "ALT", "ALKPHOS", "BILITOT", "PROT", "ALBUMIN" in the last 72 hours.  No results for input(s): "LIPASE", "AMYLASE" in the last 72 hours. CBC: Recent Labs    04/19/23 0454  WBC 6.9  HGB 11.1*  HCT 33.1*  MCV 95.4  PLT 192   Cardiac Enzymes: No results for input(s): "CKTOTAL", "CKMB", "CKMBINDEX", "TROPONINIHS" in the last 72 hours.  BNP: No results for input(s): "BNP" in the last 72 hours.  D-Dimer: No results for input(s): "DDIMER" in the last 72 hours. Hemoglobin A1C: No results for input(s): "HGBA1C" in the last 72 hours.  Fasting Lipid Panel: No results for input(s): "CHOL", "HDL", "LDLCALC", "TRIG", "CHOLHDL", "LDLDIRECT" in the last 72 hours.  Thyroid Function Tests: No results for input(s): "TSH", "T4TOTAL", "T3FREE", "THYROIDAB" in the last 72 hours.  Invalid input(s): "FREET3" Anemia Panel: No results for input(s): "VITAMINB12", "FOLATE", "FERRITIN", "TIBC", "IRON", "RETICCTPCT" in the last 72  hours.   Radiology: CARDIAC CATHETERIZATION Result Date: 04/21/2023   Prox RCA lesion is 40% stenosed.   Origin lesion is 100% stenosed.   Origin to Prox Graft lesion is 99% stenosed.   Prox Graft to Mid Graft lesion is 80% stenosed.   Ost Cx to Prox Cx lesion is 100% stenosed.   Prox LAD to Mid LAD lesion is 100% stenosed.   Ost LAD to Prox LAD lesion is 75% stenosed.   Previously placed Mid RCA to Dist RCA stent of unknown type is  widely patent.   There is severe left ventricular systolic dysfunction.   The left ventricular ejection fraction is less than 25% by visual estimate. 1.  NSTEMI 2.  Severe three-vessel coronary artery disease, with 100% stenosis proximal LAD, 100% stenosis ostial left circumflex, patent stent mid RCA 3.  Patent LIMA to LAD, patent SVG to OM1, occluded SVG to distal RCA, 99% stenosis ostium and 75% thrombotic stenosis proximal SVG to small caliber OM 2 4.  Severe dilated cardiomyopathy with estimated LVEF less than 25% Recommendations 1.  Medical therapy.  Defer intervention of SVG to small caliber OM 2 due to 1) patient no longer having chest pain off of heparin, 2) patient very likely had completed NSTEMI with troponin greater than 24,000, 3) technically challenging intervention of 89 year old vein bypass graft, 4) risk of renal failure due to increased contrast dye required for technically challenging PCI, 5) risk reward low in light of patient's advanced age and severe dilated cardiomyopathy. 2.  Dual antiplatelet therapy for 1 year 3.  Good medical management of severe dilated cardiomyopathy 4.  Aggressive risk factor modification 5.  Possible discharge home later today  DG Chest Port 1 View Result Date: 04/17/2023 CLINICAL DATA:  Wheezing EXAM: PORTABLE CHEST 1 VIEW COMPARISON:  04/16/2023 FINDINGS: Postoperative changes in the mediastinum. Cardiac enlargement. No vascular congestion. Left perihilar infiltrate, likely pneumonia or possibly asymmetric edema. This appears  similar to prior study although previous patches obscured visualization to this area on the prior study. No pleural effusion or pneumothorax. Mediastinal contours appear intact.  Calcification of the aorta. Degenerative changes in the shoulders. IMPRESSION: Persistent finding of left perihilar infiltration. Cardiac enlargement. Electronically Signed   By: Burman Nieves M.D.   On: 04/17/2023 15:36   ECHOCARDIOGRAM COMPLETE Result Date: 04/17/2023    ECHOCARDIOGRAM REPORT   Patient Name:   Jesus Stuart Date of Exam: 04/16/2023 Medical Rec #:  [id number]         Height:       69.0 in Accession #:    [id number]        Weight:       161.2 lb Date of Birth:  September 26, 1930        BSA:          1.885 m Patient Age:    89 years          BP:           112/64 mmHg Patient Gender: M                 HR:           83 bpm. Exam Location:  [facility] Procedure: 2D Echo, Cardiac Doppler, Color Doppler and Intracardiac            Opacification Agent (Both Spectral and Color Flow Doppler were            utilized during procedure). Indications:     Atrial Fibrillation  History:         Patient has no prior history of Echocardiogram examinations.                  Prior CABG, COPD, Signs/Symptoms:Syncope; Risk                  Factors:Hypertension. NSTEMI.  Sonographer:     Mikki Harbor Referring Phys:  [phone] Francoise Schaumann [place] Diagnosing Phys: Marcina Millard MD  Sonographer Comments: Image acquisition challenging due to COPD. IMPRESSIONS  1. Left ventricular ejection fraction, by estimation, is 35 to 40%. The left ventricle has moderately decreased function. The left ventricle demonstrates regional wall motion abnormalities (see scoring diagram/findings for description). Indeterminate diastolic filling due to E-A fusion.  2. Right ventricular systolic function is normal. The right ventricular size is normal. There is moderately elevated pulmonary artery systolic pressure.  3. The mitral valve is normal in structure. Moderate mitral  valve regurgitation. No evidence of mitral stenosis.  4. The aortic valve is normal in structure. Aortic valve regurgitation is moderate. No aortic stenosis is present.  5. The inferior vena cava is normal in size with greater than 50% respiratory variability, suggesting right atrial pressure of 3 mmHg. FINDINGS  Left Ventricle: Left ventricular ejection fraction, by estimation, is 35 to 40%. The left ventricle has moderately decreased function. The left ventricle demonstrates regional wall motion abnormalities. Definity contrast agent was given IV to delineate the left ventricular endocardial borders. Strain imaging was not performed. The left ventricular internal cavity size was normal in size. There is no left ventricular hypertrophy. Indeterminate diastolic filling due to E-A fusion.  LV Wall Scoring: The apical lateral segment, apical septal segment, and apex are akinetic. The mid inferoseptal segment is hypokinetic. Right Ventricle: The right ventricular size is normal. No increase in right ventricular wall thickness. Right ventricular systolic function is normal. There is moderately elevated pulmonary artery systolic pressure. The tricuspid regurgitant velocity is 3.26 m/s, and with an assumed right atrial pressure of 8 mmHg, the estimated right ventricular systolic pressure is 50.5 mmHg. Left Atrium: Left  atrial size was normal in size. Right Atrium: Right atrial size was normal in size. Pericardium: There is no evidence of pericardial effusion. Mitral Valve: The mitral valve is normal in structure. Moderate mitral valve regurgitation. No evidence of mitral valve stenosis. MV peak gradient, 6.2 mmHg. The mean mitral valve gradient is 2.0 mmHg. Tricuspid Valve: The tricuspid valve is normal in structure. Tricuspid valve regurgitation is mild . No evidence of tricuspid stenosis. Aortic Valve: The aortic valve is normal in structure. Aortic valve regurgitation is moderate. No aortic stenosis is present. Aortic  valve mean gradient measures 2.7 mmHg. Aortic valve peak gradient measures 5.6 mmHg. Aortic valve area, by VTI measures 3.03 cm. Pulmonic Valve: The pulmonic valve was normal in structure. Pulmonic valve regurgitation is not visualized. No evidence of pulmonic stenosis. Aorta: The aortic root is normal in size and structure. Venous: The inferior vena cava is normal in size with greater than 50% respiratory variability, suggesting right atrial pressure of 3 mmHg. IAS/Shunts: No atrial level shunt detected by color flow Doppler. Additional Comments: 3D imaging was not performed.  LEFT VENTRICLE PLAX 2D LVIDd:         6.30 cm LVIDs:         4.65 cm LV PW:         1.30 cm LV IVS:        1.20 cm LVOT diam:     2.00 cm LV SV:         52 LV SV Index:   28 LVOT Area:     3.14 cm  RIGHT VENTRICLE RV Basal diam:  4.15 cm RV Mid diam:    4.30 cm LEFT ATRIUM              Index        RIGHT ATRIUM           Index LA diam:        5.40 cm  2.86 cm/m   RA Area:     26.10 cm LA Vol (A2C):   106.0 ml 56.24 ml/m  RA Volume:   91.40 ml  48.49 ml/m LA Vol (A4C):   106.0 ml 56.24 ml/m LA Biplane Vol: 106.0 ml 56.24 ml/m  AORTIC VALVE                    PULMONIC VALVE AV Area (Vmax):    2.96 cm     PV Vmax:       0.72 m/s AV Area (Vmean):   2.76 cm     PV Peak grad:  2.1 mmHg AV Area (VTI):     3.03 cm AV Vmax:           118.00 cm/s AV Vmean:          73.167 cm/s AV VTI:            0.173 m AV Peak Grad:      5.6 mmHg AV Mean Grad:      2.7 mmHg LVOT Vmax:         111.00 cm/s LVOT Vmean:        64.300 cm/s LVOT VTI:          0.167 m LVOT/AV VTI ratio: 0.97  AORTA Ao Root diam: 3.90 cm MITRAL VALVE                TRICUSPID VALVE MV Area (PHT): 4.49 cm     TR Peak grad:   42.5 mmHg MV Area VTI:  1.62 cm     TR Vmax:        326.00 cm/s MV Peak grad:  6.2 mmHg MV Mean grad:  2.0 mmHg     SHUNTS MV Vmax:       1.25 m/s     Systemic VTI:  0.17 m MV Vmean:      55.3 cm/s    Systemic Diam: 2.00 cm MV Decel Time: 169 msec MV E  velocity: 106.00 cm/s Marcina Millard MD Electronically signed by Marcina Millard MD Signature Date/Time: 04/17/2023/1:07:11 PM    Final    CT CHEST WO CONTRAST Result Date: 04/16/2023 CLINICAL DATA:  Chest pain.  Syncope. EXAM: CT CHEST WITHOUT CONTRAST TECHNIQUE: Multidetector CT imaging of the chest was performed following the standard protocol without IV contrast. RADIATION DOSE REDUCTION: This exam was performed according to the departmental dose-optimization program which includes automated exposure control, adjustment of the mA and/or kV according to patient size and/or use of iterative reconstruction technique. COMPARISON:  Chest radiograph dated 04/16/2023. FINDINGS: Cardiovascular: The heart is enlarged. Prior CABG. Coronary artery calcifications. Nonaneurysmal aorta. Atherosclerotic calcifications of the thoracic aorta and arch branch vessels. Mediastinum/Nodes: There are several prominent and a few mildly enlarged mediastinal lymph nodes including a 11 mm right paratracheal lymph node and a 12 mm subcarinal lymph node. No enlarged axillary lymph nodes. Trachea and esophagus are unremarkable. Lungs/Pleura: Patchy ground-glass and consolidative opacities with a central peribronchovascular distribution throughout the left lung, most pronounced in the left upper lobe. Less pronounced patchy ground-glass opacities with a predominantly central peribronchovascular distribution in the right lung. Bilateral left-greater-than-right interlobular septal thickening. Trace left pleural effusion. No pneumothorax. Upper Abdomen: Trace upper abdominal ascites. Partially visualized 7 cm cyst arising from the superior pole of the left kidney. Musculoskeletal: No chest wall mass or suspicious bone lesions identified. Prior median sternotomy with discontinuity of multiple cerclage wires again noted. IMPRESSION: 1. Patchy ground-glass and consolidative opacities with a central peribronchovascular distribution  throughout the left lung and less pronounced patchy opacities within the right lung with interlobular septal thickening and trace left pleural effusion. These findings are favored to represent cardiogenic pulmonary edema, although, the presence of an underlying infiltrate can not be excluded. 2. Mildly enlarged and prominent mediastinal lymph nodes are likely reactive. 3. Cardiomegaly. 4. Trace upper abdominal ascites. 5. Aortic Atherosclerosis (ICD10-I70.0). Electronically Signed   By: Hart Robinsons M.D.   On: 04/16/2023 09:34   DG Chest Port 1 View Result Date: 04/16/2023 CLINICAL DATA:  Awoke with chest pain EXAM: PORTABLE CHEST 1 VIEW COMPARISON:  12/13/2019 FINDINGS: Stable borderline heart size.  Prior CABG. Asymmetric interstitial and airspace opacity to the left lung which is significantly obscured by overlapping hardware. No visible pleural effusion or pneumothorax. IMPRESSION: Asymmetric edema versus pneumonia in the left lung. Electronically Signed   By: Tiburcio Pea M.D.   On: 04/16/2023 05:41   CT Head Wo Contrast Result Date: 04/16/2023 CLINICAL DATA:  Nausea and neck trauma. EXAM: CT HEAD WITHOUT CONTRAST CT CERVICAL SPINE WITHOUT CONTRAST TECHNIQUE: Multidetector CT imaging of the head and cervical spine was performed following the standard protocol without intravenous contrast. Multiplanar CT image reconstructions of the cervical spine were also generated. RADIATION DOSE REDUCTION: This exam was performed according to the departmental dose-optimization program which includes automated exposure control, adjustment of the mA and/or kV according to patient size and/or use of iterative reconstruction technique. COMPARISON:  None Available. FINDINGS: CT HEAD FINDINGS Brain: No evidence of acute infarction, hemorrhage, hydrocephalus,  extra-axial collection or mass lesion/mass effect. Chronic small vessel disease in the cerebral white matter that is mild. 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 Vascular: No hyperdense vessel or unexpected calcification. Skull: Normal. Negative for fracture or focal lesion. Sinuses/Orbits: No evidence of injury. CT CERVICAL SPINE FINDINGS Alignment: No traumatic malalignment Skull base and vertebrae: No acute fracture.  Subjective osteopenia Soft tissues and spinal canal: No prevertebral fluid or swelling. No visible canal hematoma. Disc levels: Generalized degenerative endplate and facet spurring in keeping with age. Upper chest: Partially covered airspace disease at the left apex. IMPRESSION: 1. No evidence of acute intracranial or cervical spine injury. 2. Partially covered left apical opacity, correlate with pending chest radiograph. Electronically Signed   By: Tiburcio Pea M.D.   On: 04/16/2023 05:18   CT Cervical Spine Wo Contrast Result Date: 04/16/2023 CLINICAL DATA:  Nausea and neck trauma. EXAM: CT HEAD WITHOUT CONTRAST CT CERVICAL SPINE WITHOUT CONTRAST TECHNIQUE: Multidetector CT imaging of the head and cervical spine was performed following the standard protocol without intravenous contrast. Multiplanar CT image reconstructions of the cervical spine were also generated. RADIATION DOSE REDUCTION: This exam was performed according to the departmental dose-optimization program which includes automated exposure control, adjustment of the mA and/or kV according to patient size and/or use of iterative reconstruction technique. COMPARISON:  None Available. FINDINGS: CT HEAD FINDINGS Brain: No evidence of acute infarction, hemorrhage, hydrocephalus, extra-axial collection or mass lesion/mass effect. Chronic small vessel disease in the cerebral white matter that is mild. 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 Vascular: No hyperdense vessel or unexpected calcification. Skull: Normal. Negative for fracture or focal lesion. Sinuses/Orbits: No evidence of injury. CT CERVICAL SPINE FINDINGS Alignment: No traumatic malalignment Skull base and  vertebrae: No acute fracture.  Subjective osteopenia Soft tissues and spinal canal: No prevertebral fluid or swelling. No visible canal hematoma. Disc levels: Generalized degenerative endplate and facet spurring in keeping with age. Upper chest: Partially covered airspace disease at the left apex. IMPRESSION: 1. No evidence of acute intracranial or cervical spine injury. 2. Partially covered left apical opacity, correlate with pending chest radiograph. Electronically Signed   By: Tiburcio Pea M.D.   On: 04/16/2023 05:18    ECHO as above  TELEMETRY reviewed by me 04/21/2023: atrial fibrillation RBBB PVCs rate 80-90s  EKG reviewed by me: AF RBBB rate 89 bpm, minimal STE inferior leads  Data reviewed by me 04/21/2023: last 24h vitals tele labs imaging I/O hospitalist progress note  Principal Problem:   NSTEMI (non-ST elevated myocardial infarction) (HCC) Active Problems:   COPD (chronic obstructive pulmonary disease) (HCC)   Essential hypertension   Acute kidney injury superimposed on CKD (HCC)   Atrial fibrillation (HCC)   Syncope and collapse   Acute respiratory failure with hypoxia (HCC)   Acute on chronic heart failure with reduced ejection fraction (HFrEF, <= 40%) (HCC)   Lobar pneumonia (HCC)    ASSESSMENT AND PLAN:  Jesus Stuart is a 89 y.o. male  with a past medical history of coronary artery disease s/p CABG with a LIMA to the LAD, SVG to ramus intermedius, SVG to OM2, and SVG to RCA in 1997, PCI of the mid to distal RCA with overlapping Resolute Onyx DES on 11/2019, ischemic cardiomyopathy, frequent PVCs, COPD, and hypertension who presented to the ED on 04/16/2023 for chest pain. Initially paged as code STEMI, this was cancelled by Dr. Okey Dupre. Cardiology was consulted for further evaluation.   # NSTEMI # Coronary artery  disease s/p CABG 1997 # Acute on chronic HFrEF Patient presented with significant chest pain episode last night, initially code STEMI was called for inferior ST  elevation however this was canceled as this had improved on repeat EKG in the ED.  Troponin trended 259 > 4685 > 24000. Echo with EF 35-40%, apical akinesis and inferoseptal hypokinesis. LHC today with patent LIMA, patent SVG to OM1, occluded SVG to distal RCA, 99% ostial stenosis of SVG to small caliber OM 2.  Intervention was deferred given patient now asymptomatic, difficult intervention of 89 year old bypass graft, risk of renal injury and given age of patient. -S/p ASA 325 with EMS.  Continue aspirin 81 mg daily and atorvastatin 40 mg daily.  He had been taking aspirin and Plavix until earlier this month when these were discontinued and he was started on Eliquis for atrial fibrillation.  Will plan to send home on aspirin and Eliquis as he was previously taking.  Will defer short-term triple therapy. -Continue metoprolol 25 mg daily. Resume home entresto on discharge.   # Persistent atrial fibrillation Patient with history of atrial fibrillation.  In atrial fibrillation with right bundle branch block on EKG in the ED.  Rate overall controlled. -Resume eliquis tomorrow morning.  -Metoprolol as above  Ok for discharge today from a cardiac perspective. Will arrange for follow up in clinic with Dr. Darrold Junker in 1-2 weeks.    This patient's plan of care was discussed and created with Dr. Juliann Pares and he is in agreement.  Signed: Gale Journey, PA-C  04/21/2023, 10:00 AM [facility]

## 2023-04-22 DIAGNOSIS — I200 Unstable angina: Principal | ICD-10-CM

## 2023-04-22 DIAGNOSIS — M79672 Pain in left foot: Secondary | ICD-10-CM

## 2023-04-22 DIAGNOSIS — S51019A Laceration without foreign body of unspecified elbow, initial encounter: Secondary | ICD-10-CM | POA: Diagnosis not present

## 2023-04-22 DIAGNOSIS — M79671 Pain in right foot: Secondary | ICD-10-CM

## 2023-04-22 DIAGNOSIS — R55 Syncope and collapse: Secondary | ICD-10-CM | POA: Diagnosis not present

## 2023-04-22 DIAGNOSIS — I214 Non-ST elevation (NSTEMI) myocardial infarction: Secondary | ICD-10-CM | POA: Diagnosis not present

## 2023-04-22 MED ORDER — ENTRESTO 24-26 MG PO TABS: 1.0000 | ORAL_TABLET | Freq: Two times a day (BID) | ORAL | Status: DC

## 2023-04-22 MED ORDER — METOPROLOL SUCCINATE ER 50 MG PO TB24
50.0000 mg | ORAL_TABLET | Freq: Every day | ORAL | Status: DC
Start: 2023-04-22 — End: 2023-04-22
  Administered 2023-04-22: 50 mg via ORAL
  Filled 2023-04-22: qty 1

## 2023-04-22 MED ORDER — TRAMADOL HCL 50 MG PO TABS: 50.0000 mg | ORAL_TABLET | Freq: Four times a day (QID) | ORAL | 0 refills | Status: DC | PRN

## 2023-04-22 MED ORDER — GABAPENTIN 100 MG PO CAPS: 100.0000 mg | ORAL_CAPSULE | Freq: Two times a day (BID) | ORAL | 0 refills | Status: DC

## 2023-04-22 NOTE — Progress Notes (Signed)
 [facility] CARDIOLOGY PROGRESS NOTE       [name] MRN: [id number] DOB/AGE: [birth year] 88 y.o.  Admit date: 04/16/2023 Referring Physician Dr. Pilar Jarvis Primary Physician Lauro Regulus, MD  Primary Cardiologist Dr. Juliann Pares Reason for Consultation NSTEMI  HPI: Jesus Stuart is a 89 y.o. male  with a past medical history of coronary artery disease s/p CABG with a LIMA to the LAD, SVG to ramus intermedius, SVG to OM2, and SVG to RCA in 1997, PCI of the mid to distal RCA with overlapping Resolute Onyx DES on 11/2019, ischemic cardiomyopathy, frequent PVCs, COPD, and hypertension who presented to the ED on 04/16/2023 for chest pain. Initially paged as code STEMI, this was cancelled by Dr. Okey Dupre. Cardiology was consulted for further evaluation.   Interval history: -Patient seen and examined this AM, reports he feels well overall. -Denies any chest pain.  States he has shortness of breath but feels this is related to his asthma. -Left radial access site without any bruising or evidence of bleeding.  No tenderness. -BP and HR remain stable.  Review of systems complete and found to be negative unless listed above    Past Medical History:  Diagnosis Date   Atrial fibrillation (HCC) 03/2023   BPH (benign prostatic hyperplasia)    COPD (chronic obstructive pulmonary disease) (HCC)    Coronary artery disease    HFrEF (heart failure with reduced ejection fraction) (HCC)    LVEF 25-30% by echo in 2022   Hypertension    MI (myocardial infarction) (HCC)    Reactive airway disease     Past Surgical History:  Procedure Laterality Date   CORONARY ARTERY BYPASS GRAFT     CORONARY STENT INTERVENTION N/A 12/08/2019   Procedure: CORONARY STENT INTERVENTION;  Surgeon: Alwyn Pea, MD;  Location: [facility] INVASIVE CV LAB;  Service: Cardiovascular;  Laterality: N/A;   LEFT HEART CATH AND CORONARY ANGIOGRAPHY Left 12/08/2019   Procedure: LEFT HEART CATH AND  CORONARY ANGIOGRAPHY;  Surgeon: Alwyn Pea, MD;  Location: [facility] INVASIVE CV LAB;  Service: Cardiovascular;  Laterality: Left;   LEFT HEART CATH AND CORS/GRAFTS ANGIOGRAPHY N/A 04/21/2023   Procedure: LEFT HEART CATH AND CORS/GRAFTS ANGIOGRAPHY;  Surgeon: Marcina Millard, MD;  Location: [facility] INVASIVE CV LAB;  Service: Cardiovascular;  Laterality: N/A;    Medications Prior to Admission  Medication Sig Dispense Refill Last Dose/Taking   albuterol (VENTOLIN HFA) 108 (90 Base) MCG/ACT inhaler Inhale 1-2 puffs into the lungs every 6 (six) hours as needed for wheezing or shortness of breath.   04/15/2023   apixaban (ELIQUIS) 5 MG TABS tablet Take 5 mg by mouth 2 (two) times daily.   04/15/2023   atorvastatin (LIPITOR) 40 MG tablet Take 1 tablet (40 mg total) by mouth daily. 30 tablet 11 04/15/2023   budesonide-formoterol (SYMBICORT) 160-4.5 MCG/ACT inhaler Inhale 2 puffs into the lungs 2 (two) times daily as needed (respiratory issues.).   Taking As Needed   colchicine 0.6 MG tablet Take 0.6 mg by mouth 2 (two) times daily. As needed   Taking   ENTRESTO 24-26 MG Take 1 tablet by mouth 2 (two) times daily.   04/15/2023   hydrochlorothiazide (HYDRODIURIL) 25 MG tablet Take 1 tablet by mouth daily.   04/15/2023   isosorbide mononitrate (IMDUR) 30 MG 24 hr tablet Take 1 tablet (30 mg total) by mouth every evening. 30 tablet 1 04/15/2023   metoprolol succinate (TOPROL-XL) 25 MG 24 hr tablet Take 1 tablet by mouth  daily.   04/15/2023   nitroGLYCERIN (NITROSTAT) 0.4 MG SL tablet Place 0.4 mg under the tongue every 5 (five) minutes x 3 doses as needed for chest pain.   04/16/2023 Morning   ranolazine (RANEXA) 500 MG 12 hr tablet Take 1 tablet by mouth 2 (two) times daily.   04/15/2023   tamsulosin (FLOMAX) 0.4 MG CAPS capsule Take 1 capsule (0.4 mg total) by mouth daily after supper. 30 capsule 1 04/15/2023   aspirin EC 81 MG tablet Take 81 mg by mouth at bedtime. Swallow whole. (Patient not taking: Reported on  04/16/2023)   Not Taking   clopidogrel (PLAVIX) 75 MG tablet Take 75 mg by mouth at bedtime. (Patient not taking: Reported on 04/16/2023)   Not Taking   pantoprazole (PROTONIX) 40 MG tablet Take 40 mg by mouth at bedtime. (Patient not taking: Reported on 12/13/2019)   Not Taking   Social History   Socioeconomic History   Marital status: Married    Spouse name: Not on file   Number of children: Not on file   Years of education: Not on file   Highest education level: Not on file  Occupational History   Not on file  Tobacco Use   Smoking status: Former   Smokeless tobacco: Never   Tobacco comments:    quit 61yrs ago  Vaping Use   Vaping status: Never Used  Substance and Sexual Activity   Alcohol use: Never   Drug use: Never   Sexual activity: Never  Other Topics Concern   Not on file  Social History Narrative   Not on file   Social Drivers of Health   Financial Resource Strain: Low Risk  (03/13/2023)   Received from [facility]   Overall Financial Resource Strain (CARDIA)    Difficulty of Paying Living Expenses: Not hard at all  Food Insecurity: No Food Insecurity (04/16/2023)   Hunger Vital Sign    Worried About Running Out of Food in the Last Year: Never true    Ran Out of Food in the Last Year: Never true  Transportation Needs: No Transportation Needs (04/16/2023)   PRAPARE - Administrator, Civil Service (Medical): No    Lack of Transportation (Non-Medical): No  Physical Activity: Not on file  Stress: Not on file  Social Connections: Socially Integrated (04/16/2023)   Social Connection and Isolation Panel [NHANES]    Frequency of Communication with Friends and Family: Three times a week    Frequency of Social Gatherings with Friends and Family: Three times a week    Attends Religious Services: More than 4 times per year    Active Member of Clubs or Organizations: No    Attends Banker Meetings: More than 4 times per year     Marital Status: Married  Catering manager Violence: Not At Risk (04/16/2023)   Humiliation, Afraid, Rape, and Kick questionnaire    Fear of Current or Ex-Partner: No    Emotionally Abused: No    Physically Abused: No    Sexually Abused: No    Family History  Problem Relation Age of Onset   Stroke Mother      Vitals:   04/21/23 2354 04/22/23 0100 04/22/23 0200 04/22/23 0329  BP: 131/78   119/70  Pulse: 92   99  Resp: (!) 24 19 20  (!) 21  Temp: 97.9 F (36.6 C)   97.8 F (36.6 C)  TempSrc:      SpO2: 98%  100%  Weight:      Height:        PHYSICAL EXAM General: Chronically ill appearing elderly male, well nourished, in no acute distress. HEENT: Normocephalic and atraumatic. Neck: No JVD.  Lungs: Normal respiratory effort on RA. Clear bilaterally to auscultation. No wheezes, crackles, rhonchi.  Heart: Irregularly irregular. Normal S1 and S2 without gallops or murmurs.  Abdomen: Non-distended appearing.  Msk: Normal strength and tone for age. Extremities: Warm and well perfused. No clubbing, cyanosis. No edema.  Neuro: Alert and oriented X 3. Psych: Answers questions appropriately.   Labs: Basic Metabolic Panel: No results for input(s): "NA", "K", "CL", "CO2", "GLUCOSE", "BUN", "CREATININE", "CALCIUM", "MG", "PHOS" in the last 72 hours.  Liver Function Tests: No results for input(s): "AST", "ALT", "ALKPHOS", "BILITOT", "PROT", "ALBUMIN" in the last 72 hours.  No results for input(s): "LIPASE", "AMYLASE" in the last 72 hours. CBC: No results for input(s): "WBC", "NEUTROABS", "HGB", "HCT", "MCV", "PLT" in the last 72 hours.  Cardiac Enzymes: No results for input(s): "CKTOTAL", "CKMB", "CKMBINDEX", "TROPONINIHS" in the last 72 hours.  BNP: No results for input(s): "BNP" in the last 72 hours.  D-Dimer: No results for input(s): "DDIMER" in the last 72 hours. Hemoglobin A1C: No results for input(s): "HGBA1C" in the last 72 hours.  Fasting Lipid Panel: No results  for input(s): "CHOL", "HDL", "LDLCALC", "TRIG", "CHOLHDL", "LDLDIRECT" in the last 72 hours.  Thyroid Function Tests: No results for input(s): "TSH", "T4TOTAL", "T3FREE", "THYROIDAB" in the last 72 hours.  Invalid input(s): "FREET3" Anemia Panel: No results for input(s): "VITAMINB12", "FOLATE", "FERRITIN", "TIBC", "IRON", "RETICCTPCT" in the last 72 hours.   Radiology: CARDIAC CATHETERIZATION Result Date: 04/21/2023   Prox RCA lesion is 40% stenosed.   Origin lesion is 100% stenosed.   Origin to Prox Graft lesion is 99% stenosed.   Prox Graft to Mid Graft lesion is 80% stenosed.   Ost Cx to Prox Cx lesion is 100% stenosed.   Prox LAD to Mid LAD lesion is 100% stenosed.   Ost LAD to Prox LAD lesion is 75% stenosed.   Previously placed Mid RCA to Dist RCA stent of unknown type is  widely patent.   There is severe left ventricular systolic dysfunction.   The left ventricular ejection fraction is less than 25% by visual estimate. 1.  NSTEMI 2.  Severe three-vessel coronary artery disease, with 100% stenosis proximal LAD, 100% stenosis ostial left circumflex, patent stent mid RCA 3.  Patent LIMA to LAD, patent SVG to OM1, occluded SVG to distal RCA, 99% stenosis ostium and 75% thrombotic stenosis proximal SVG to small caliber OM 2 4.  Severe dilated cardiomyopathy with estimated LVEF less than 25% Recommendations 1.  Medical therapy.  Defer intervention of SVG to small caliber OM 2 due to 1) patient no longer having chest pain off of heparin, 2) patient very likely had completed NSTEMI with troponin greater than 24,000, 3) technically challenging intervention of 89 year old vein bypass graft, 4) risk of renal failure due to increased contrast dye required for technically challenging PCI, 5) risk reward low in light of patient's advanced age and severe dilated cardiomyopathy. 2.  Dual antiplatelet therapy for 1 year 3.  Good medical management of severe dilated cardiomyopathy 4.  Aggressive risk factor  modification 5.  Possible discharge home later today  DG Chest Port 1 View Result Date: 04/17/2023 CLINICAL DATA:  Wheezing EXAM: PORTABLE CHEST 1 VIEW COMPARISON:  04/16/2023 FINDINGS: Postoperative changes in the mediastinum. Cardiac enlargement. No vascular congestion.  Left perihilar infiltrate, likely pneumonia or possibly asymmetric edema. This appears similar to prior study although previous patches obscured visualization to this area on the prior study. No pleural effusion or pneumothorax. Mediastinal contours appear intact. Calcification of the aorta. Degenerative changes in the shoulders. IMPRESSION: Persistent finding of left perihilar infiltration. Cardiac enlargement. Electronically Signed   By: Burman Nieves M.D.   On: 04/17/2023 15:36   ECHOCARDIOGRAM COMPLETE Result Date: 04/17/2023    ECHOCARDIOGRAM REPORT   Patient Name:   KASIR HALLENBECK Date of Exam: 04/16/2023 Medical Rec #:  [id number]         Height:       69.0 in Accession #:    [id number]        Weight:       161.2 lb Date of Birth:  March 29, 1930        BSA:          1.885 m Patient Age:    89 years          BP:           112/64 mmHg Patient Gender: M                 HR:           83 bpm. Exam Location:  [facility] Procedure: 2D Echo, Cardiac Doppler, Color Doppler and Intracardiac            Opacification Agent (Both Spectral and Color Flow Doppler were            utilized during procedure). Indications:     Atrial Fibrillation  History:         Patient has no prior history of Echocardiogram examinations.                  Prior CABG, COPD, Signs/Symptoms:Syncope; Risk                  Factors:Hypertension. NSTEMI.  Sonographer:     Mikki Harbor Referring Phys:  [phone] Francoise Schaumann [place] Diagnosing Phys: Marcina Millard MD  Sonographer Comments: Image acquisition challenging due to COPD. IMPRESSIONS  1. Left ventricular ejection fraction, by estimation, is 35 to 40%. The left ventricle has moderately decreased function. The left  ventricle demonstrates regional wall motion abnormalities (see scoring diagram/findings for description). Indeterminate diastolic filling due to E-A fusion.  2. Right ventricular systolic function is normal. The right ventricular size is normal. There is moderately elevated pulmonary artery systolic pressure.  3. The mitral valve is normal in structure. Moderate mitral valve regurgitation. No evidence of mitral stenosis.  4. The aortic valve is normal in structure. Aortic valve regurgitation is moderate. No aortic stenosis is present.  5. The inferior vena cava is normal in size with greater than 50% respiratory variability, suggesting right atrial pressure of 3 mmHg. FINDINGS  Left Ventricle: Left ventricular ejection fraction, by estimation, is 35 to 40%. The left ventricle has moderately decreased function. The left ventricle demonstrates regional wall motion abnormalities. Definity contrast agent was given IV to delineate the left ventricular endocardial borders. Strain imaging was not performed. The left ventricular internal cavity size was normal in size. There is no left ventricular hypertrophy. Indeterminate diastolic filling due to E-A fusion.  LV Wall Scoring: The apical lateral segment, apical septal segment, and apex are akinetic. The mid inferoseptal segment is hypokinetic. Right Ventricle: The right ventricular size is normal. No increase in right ventricular wall thickness. Right ventricular systolic function is normal. There  is moderately elevated pulmonary artery systolic pressure. The tricuspid regurgitant velocity is 3.26 m/s, and with an assumed right atrial pressure of 8 mmHg, the estimated right ventricular systolic pressure is 50.5 mmHg. Left Atrium: Left atrial size was normal in size. Right Atrium: Right atrial size was normal in size. Pericardium: There is no evidence of pericardial effusion. Mitral Valve: The mitral valve is normal in structure. Moderate mitral valve regurgitation. No  evidence of mitral valve stenosis. MV peak gradient, 6.2 mmHg. The mean mitral valve gradient is 2.0 mmHg. Tricuspid Valve: The tricuspid valve is normal in structure. Tricuspid valve regurgitation is mild . No evidence of tricuspid stenosis. Aortic Valve: The aortic valve is normal in structure. Aortic valve regurgitation is moderate. No aortic stenosis is present. Aortic valve mean gradient measures 2.7 mmHg. Aortic valve peak gradient measures 5.6 mmHg. Aortic valve area, by VTI measures 3.03 cm. Pulmonic Valve: The pulmonic valve was normal in structure. Pulmonic valve regurgitation is not visualized. No evidence of pulmonic stenosis. Aorta: The aortic root is normal in size and structure. Venous: The inferior vena cava is normal in size with greater than 50% respiratory variability, suggesting right atrial pressure of 3 mmHg. IAS/Shunts: No atrial level shunt detected by color flow Doppler. Additional Comments: 3D imaging was not performed.  LEFT VENTRICLE PLAX 2D LVIDd:         6.30 cm LVIDs:         4.65 cm LV PW:         1.30 cm LV IVS:        1.20 cm LVOT diam:     2.00 cm LV SV:         52 LV SV Index:   28 LVOT Area:     3.14 cm  RIGHT VENTRICLE RV Basal diam:  4.15 cm RV Mid diam:    4.30 cm LEFT ATRIUM              Index        RIGHT ATRIUM           Index LA diam:        5.40 cm  2.86 cm/m   RA Area:     26.10 cm LA Vol (A2C):   106.0 ml 56.24 ml/m  RA Volume:   91.40 ml  48.49 ml/m LA Vol (A4C):   106.0 ml 56.24 ml/m LA Biplane Vol: 106.0 ml 56.24 ml/m  AORTIC VALVE                    PULMONIC VALVE AV Area (Vmax):    2.96 cm     PV Vmax:       0.72 m/s AV Area (Vmean):   2.76 cm     PV Peak grad:  2.1 mmHg AV Area (VTI):     3.03 cm AV Vmax:           118.00 cm/s AV Vmean:          73.167 cm/s AV VTI:            0.173 m AV Peak Grad:      5.6 mmHg AV Mean Grad:      2.7 mmHg LVOT Vmax:         111.00 cm/s LVOT Vmean:        64.300 cm/s LVOT VTI:          0.167 m LVOT/AV VTI ratio: 0.97   AORTA Ao Root diam: 3.90 cm MITRAL  VALVE                TRICUSPID VALVE MV Area (PHT): 4.49 cm     TR Peak grad:   42.5 mmHg MV Area VTI:   1.62 cm     TR Vmax:        326.00 cm/s MV Peak grad:  6.2 mmHg MV Mean grad:  2.0 mmHg     SHUNTS MV Vmax:       1.25 m/s     Systemic VTI:  0.17 m MV Vmean:      55.3 cm/s    Systemic Diam: 2.00 cm MV Decel Time: 169 msec MV E velocity: 106.00 cm/s Marcina Millard MD Electronically signed by Marcina Millard MD Signature Date/Time: 04/17/2023/1:07:11 PM    Final    CT CHEST WO CONTRAST Result Date: 04/16/2023 CLINICAL DATA:  Chest pain.  Syncope. EXAM: CT CHEST WITHOUT CONTRAST TECHNIQUE: Multidetector CT imaging of the chest was performed following the standard protocol without IV contrast. RADIATION DOSE REDUCTION: This exam was performed according to the departmental dose-optimization program which includes automated exposure control, adjustment of the mA and/or kV according to patient size and/or use of iterative reconstruction technique. COMPARISON:  Chest radiograph dated 04/16/2023. FINDINGS: Cardiovascular: The heart is enlarged. Prior CABG. Coronary artery calcifications. Nonaneurysmal aorta. Atherosclerotic calcifications of the thoracic aorta and arch branch vessels. Mediastinum/Nodes: There are several prominent and a few mildly enlarged mediastinal lymph nodes including a 11 mm right paratracheal lymph node and a 12 mm subcarinal lymph node. No enlarged axillary lymph nodes. Trachea and esophagus are unremarkable. Lungs/Pleura: Patchy ground-glass and consolidative opacities with a central peribronchovascular distribution throughout the left lung, most pronounced in the left upper lobe. Less pronounced patchy ground-glass opacities with a predominantly central peribronchovascular distribution in the right lung. Bilateral left-greater-than-right interlobular septal thickening. Trace left pleural effusion. No pneumothorax. Upper Abdomen: Trace upper  abdominal ascites. Partially visualized 7 cm cyst arising from the superior pole of the left kidney. Musculoskeletal: No chest wall mass or suspicious bone lesions identified. Prior median sternotomy with discontinuity of multiple cerclage wires again noted. IMPRESSION: 1. Patchy ground-glass and consolidative opacities with a central peribronchovascular distribution throughout the left lung and less pronounced patchy opacities within the right lung with interlobular septal thickening and trace left pleural effusion. These findings are favored to represent cardiogenic pulmonary edema, although, the presence of an underlying infiltrate can not be excluded. 2. Mildly enlarged and prominent mediastinal lymph nodes are likely reactive. 3. Cardiomegaly. 4. Trace upper abdominal ascites. 5. Aortic Atherosclerosis (ICD10-I70.0). Electronically Signed   By: Hart Robinsons M.D.   On: 04/16/2023 09:34   DG Chest Port 1 View Result Date: 04/16/2023 CLINICAL DATA:  Awoke with chest pain EXAM: PORTABLE CHEST 1 VIEW COMPARISON:  12/13/2019 FINDINGS: Stable borderline heart size.  Prior CABG. Asymmetric interstitial and airspace opacity to the left lung which is significantly obscured by overlapping hardware. No visible pleural effusion or pneumothorax. IMPRESSION: Asymmetric edema versus pneumonia in the left lung. Electronically Signed   By: Tiburcio Pea M.D.   On: 04/16/2023 05:41   CT Head Wo Contrast Result Date: 04/16/2023 CLINICAL DATA:  Nausea and neck trauma. EXAM: CT HEAD WITHOUT CONTRAST CT CERVICAL SPINE WITHOUT CONTRAST TECHNIQUE: Multidetector CT imaging of the head and cervical spine was performed following the standard protocol without intravenous contrast. Multiplanar CT image reconstructions of the cervical spine were also generated. RADIATION DOSE REDUCTION: This exam was performed according to the departmental dose-optimization  program which includes automated exposure control, adjustment of the mA  and/or kV according to patient size and/or use of iterative reconstruction technique. COMPARISON:  None Available. FINDINGS: CT HEAD FINDINGS Brain: No evidence of acute infarction, hemorrhage, hydrocephalus, extra-axial collection or mass lesion/mass effect. Chronic small vessel disease in the cerebral white matter that is mild. 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 Vascular: No hyperdense vessel or unexpected calcification. Skull: Normal. Negative for fracture or focal lesion. Sinuses/Orbits: No evidence of injury. CT CERVICAL SPINE FINDINGS Alignment: No traumatic malalignment Skull base and vertebrae: No acute fracture.  Subjective osteopenia Soft tissues and spinal canal: No prevertebral fluid or swelling. No visible canal hematoma. Disc levels: Generalized degenerative endplate and facet spurring in keeping with age. Upper chest: Partially covered airspace disease at the left apex. IMPRESSION: 1. No evidence of acute intracranial or cervical spine injury. 2. Partially covered left apical opacity, correlate with pending chest radiograph. Electronically Signed   By: Tiburcio Pea M.D.   On: 04/16/2023 05:18   CT Cervical Spine Wo Contrast Result Date: 04/16/2023 CLINICAL DATA:  Nausea and neck trauma. EXAM: CT HEAD WITHOUT CONTRAST CT CERVICAL SPINE WITHOUT CONTRAST TECHNIQUE: Multidetector CT imaging of the head and cervical spine was performed following the standard protocol without intravenous contrast. Multiplanar CT image reconstructions of the cervical spine were also generated. RADIATION DOSE REDUCTION: This exam was performed according to the departmental dose-optimization program which includes automated exposure control, adjustment of the mA and/or kV according to patient size and/or use of iterative reconstruction technique. COMPARISON:  None Available. FINDINGS: CT HEAD FINDINGS Brain: No evidence of acute infarction, hemorrhage, hydrocephalus, extra-axial collection or mass  lesion/mass effect. Chronic small vessel disease in the cerebral white matter that is mild. 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 Vascular: No hyperdense vessel or unexpected calcification. Skull: Normal. Negative for fracture or focal lesion. Sinuses/Orbits: No evidence of injury. CT CERVICAL SPINE FINDINGS Alignment: No traumatic malalignment Skull base and vertebrae: No acute fracture.  Subjective osteopenia Soft tissues and spinal canal: No prevertebral fluid or swelling. No visible canal hematoma. Disc levels: Generalized degenerative endplate and facet spurring in keeping with age. Upper chest: Partially covered airspace disease at the left apex. IMPRESSION: 1. No evidence of acute intracranial or cervical spine injury. 2. Partially covered left apical opacity, correlate with pending chest radiograph. Electronically Signed   By: Tiburcio Pea M.D.   On: 04/16/2023 05:18    ECHO as above  TELEMETRY reviewed by me 04/22/2023: atrial fibrillation RBBB PVCs rate 90s  EKG reviewed by me: AF RBBB rate 89 bpm, minimal STE inferior leads  Data reviewed by me 04/22/2023: last 24h vitals tele labs imaging I/O hospitalist progress note  Principal Problem:   NSTEMI (non-ST elevated myocardial infarction) (HCC) Active Problems:   COPD (chronic obstructive pulmonary disease) (HCC)   Essential hypertension   Acute kidney injury superimposed on CKD (HCC)   Atrial fibrillation (HCC)   Syncope and collapse   Acute respiratory failure with hypoxia (HCC)   Acute on chronic heart failure with reduced ejection fraction (HFrEF, <= 40%) (HCC)   Lobar pneumonia (HCC)   Foot pain, bilateral    ASSESSMENT AND PLAN:  JAELAN RASHEED is a 89 y.o. male  with a past medical history of coronary artery disease s/p CABG with a LIMA to the LAD, SVG to ramus intermedius, SVG to OM2, and SVG to RCA in 1997, PCI of the mid to distal RCA with overlapping Resolute Onyx DES  on 11/2019, ischemic cardiomyopathy,  frequent PVCs, COPD, and hypertension who presented to the ED on 04/16/2023 for chest pain. Initially paged as code STEMI, this was cancelled by Dr. Okey Dupre. Cardiology was consulted for further evaluation.   # NSTEMI # Coronary artery disease s/p CABG 1997 # Acute on chronic HFrEF Patient presented with significant chest pain episode last night, initially code STEMI was called for inferior ST elevation however this was canceled as this had improved on repeat EKG in the ED.  Troponin trended 259 > 4685 > 24000. Echo with EF 35-40%, apical akinesis and inferoseptal hypokinesis. LHC today with patent LIMA, patent SVG to OM1, occluded SVG to distal RCA, 99% ostial stenosis of SVG to small caliber OM 2.  Intervention was deferred given patient now asymptomatic, difficult intervention of 89 year old bypass graft, risk of renal injury and given age of patient. -S/p ASA 325 with EMS.  Continue aspirin 81 mg daily and atorvastatin 40 mg daily.  He had been taking aspirin and Plavix until earlier this month when these were discontinued and he was started on Eliquis for atrial fibrillation.  Will plan to send home on aspirin and Eliquis as he was previously taking.  Will defer short-term triple therapy. -Will increase metoprolol to 50 mg daily. Resume home entresto on discharge.   # Persistent atrial fibrillation Patient with history of atrial fibrillation.  In atrial fibrillation with right bundle branch block on EKG in the ED.  Rate overall controlled. -Continue Eliquis 5 mg twice daily. -Metoprolol as above  Ok for discharge today from a cardiac perspective. Will arrange for follow up in clinic with Dr. Darrold Junker in 1-2 weeks.    This patient's plan of care was discussed and created with Dr. Juliann Pares and he is in agreement.  Signed: Gale Journey, PA-C  04/22/2023, 8:15 AM [facility]

## 2023-04-22 NOTE — Discharge Summary (Addendum)
 Physician Discharge Summary   Patient: Jesus Stuart MRN: [id number] DOB: August 16, 1930  Admit date:     04/16/2023  Discharge date: 04/22/23  Discharge Physician: Arnetha Courser   PCP: Lauro Regulus, MD   Recommendations at discharge:  Please obtain CBC and BMP on follow-up His home Sherryll Burger has been held-cardiology will restart as appropriate Follow-up with cardiology within a week Follow-up with primary care provider Patient was started on gabapentin for foot pain-primary care provider can titrate or stop as appropriate  Discharge Diagnoses: Principal Problem:   NSTEMI (non-ST elevated myocardial infarction) [facility]) Active Problems:   Acute kidney injury superimposed on CKD (HCC)   COPD (chronic obstructive pulmonary disease) (HCC)   Lobar pneumonia (HCC)   Acute respiratory failure with hypoxia (HCC)   Acute on chronic heart failure with reduced ejection fraction (HFrEF, <= 40%) (HCC)   Atrial fibrillation (HCC)   Foot pain, bilateral   Essential hypertension   Syncope and collapse   Hospital Course: 89 y.o. male with medical history significant of CAD status post CABG, COPD, HFrEF, hypertension, atrial fibrillation presenting with NSTEMI, atrial fibrillation, syncope.  Patient reports having central chest pain for roughly 1 week or so.  Was seen by [facility] cardiology on February 11 with concern for chest pain.  Noted had new onset atrial fibrillation.  Started on Eliquis at that time.  Chest pressure has persisted.  Noted remote history of MI in the past.  Symptoms are not similar.  No shortness of breath.  No abdominal pain.  No nausea or vomiting.  No lower extremity swelling or orthopnea.  Has been compliant with home regimen.  Was initially evaluated as a code STEMI in the field with noted inferior ST elevations.  Was evaluated by Dr. Okey Dupre on arrival to the ER with resolution of ST elevations.  Code STEMI canceled.  Noted syncopal event associated with chest pain.  No report  of focal hemiparesis or confusion. Presented to the ER afebrile, hemodynamically stable.  Satting upper 80s on room air.  Placed on 2L [place] to keep O2 sats >94%. White count 10.1, hemoglobin 12.7, platelets 201, troponin 259--> 4685.  Creatinine 1.72.  Lactate of 5.  BNP of 3152.  Chest x-ray with asymmetric edema versus pneumonia of the left lung, CT head stable, CT C-spine stable.  2/27.  With creatinine elevated 2.02, cardiac catheterization was canceled.  Patient on heparin drip.  Patient complains of shortness of breath and asking for his inhalers. 2/28.  Creatinine down to 1.51 today. 3/1: Creatinine now at baseline, 1.27 today.  Potassium 3.3 with magnesium of 2.3.  Cardiac cath is being scheduled for Monday.  Eliquis is being held from today's p.m. dose as recommended by cardiology. 3/3: Had cardiac cath today Has occluded SVG to RCA and 99% stenosis of SVG to small caliber OM2. Intervention deferred given age of graft and also concern of renal function.  They are recommending medical management.  Patient will resume Eliquis from tomorrow.  Developed new onset bilateral feet pain, it was sharp and patient was unable to stand.  Starting on gabapentin and tramadol as needed, patient would like to stay another night as he is unable to walk at this time due to this pain.  3/4: Remained hemodynamically stable and appears to be at baseline.  Foot pain controlled with tramadol and gabapentin.  Patient was given a prescription of low-dose gabapentin and some tramadol and advised to use only if Tylenol does not work.   His home  HCTZ was discontinued and Sherryll Burger was held as advised by cardiology.  They will restart as outpatient if blood pressure allows.  Home health services was ordered at son's request.  Initially declined by patient but now he agrees.  Patient will continue the rest of his home medications and follow-up with his providers for further assistance.  Assessment and Plan: * NSTEMI  (non-ST elevated myocardial infarction) (HCC) Troponin greater than 24,000.  Continue aspirin, low-dose Toprol and Lipitor.  LDL 49.  Patient was on heparin drip for 2 days. -Cardiac cath today with occluded SVG to RCA and 99% stenosis of SVG to small caliber OM2. Intervention deferred given age of graft and concern of renal function.  They are recommending medical management. -Continue current management  Acute kidney injury superimposed on CKD (HCC) AKI on CKD stage IIIa.  Creatinine worsened up to 2.02 on 2/27.  Continue to improve and now at baseline of 1.27 baseline creatinine about 1.2-1.4.   -Monitor renal function -Avoid nephrotoxin  Lobar pneumonia (HCC) Started on Rocephin and Zithromax on 2/27.  COPD (chronic obstructive pulmonary disease) (HCC) Continue inhalers  Acute respiratory failure with hypoxia (HCC) 1 pulse ox of 89% on room air.  Now on room air  Acute on chronic heart failure with reduced ejection fraction (HFrEF, <= 40%) (HCC) Restarted on Toprol.  Other medications limited secondary to acute kidney injury and hypotension  Foot pain, bilateral Patient developed new onset bilateral foot pain, pretty tender to touch and unable to walk.  He described it as sharp pain.  No burning.  No significant erythema. -Ordered some gabapentin and tramadol -Continue to monitor  Atrial fibrillation (HCC) Persistent in nature.  Cardiology placed back on Eliquis low-dose.  Essential hypertension Toprol-XL restarted  Syncope and collapse CT head WNL    Consultants: Cardiology Procedures performed: Left heart catheterization Disposition: Home Diet recommendation:  Discharge Diet Orders (From admission, onward)     Start     Ordered   04/22/23 0000  Diet - low sodium heart healthy        04/22/23 1039           Cardiac diet DISCHARGE MEDICATION: Allergies as of 04/22/2023       Reactions   Penicillin G Rash        Medication List     STOP taking these  medications    clopidogrel 75 MG tablet Commonly known as: PLAVIX   hydrochlorothiazide 25 MG tablet Commonly known as: HYDRODIURIL   pantoprazole 40 MG tablet Commonly known as: PROTONIX       TAKE these medications    albuterol 108 (90 Base) MCG/ACT inhaler Commonly known as: VENTOLIN HFA Inhale 1-2 puffs into the lungs every 6 (six) hours as needed for wheezing or shortness of breath.   apixaban 5 MG Tabs tablet Commonly known as: ELIQUIS Take 5 mg by mouth 2 (two) times daily.   aspirin EC 81 MG tablet Take 81 mg by mouth at bedtime. Swallow whole.   atorvastatin 40 MG tablet Commonly known as: LIPITOR Take 1 tablet (40 mg total) by mouth daily.   budesonide-formoterol 160-4.5 MCG/ACT inhaler Commonly known as: SYMBICORT Inhale 2 puffs into the lungs 2 (two) times daily as needed (respiratory issues.).   colchicine 0.6 MG tablet Take 0.6 mg by mouth 2 (two) times daily. As needed   Entresto 24-26 MG Generic drug: sacubitril-valsartan Take 1 tablet by mouth 2 (two) times daily. Hold until seen by your cardiologist What changed: additional instructions  gabapentin 100 MG capsule Commonly known as: NEURONTIN Take 1 capsule (100 mg total) by mouth 2 (two) times daily.   isosorbide mononitrate 30 MG 24 hr tablet Commonly known as: IMDUR Take 1 tablet (30 mg total) by mouth every evening.   metoprolol succinate 25 MG 24 hr tablet Commonly known as: TOPROL-XL Take 1 tablet by mouth daily.   nitroGLYCERIN 0.4 MG SL tablet Commonly known as: NITROSTAT Place 0.4 mg under the tongue every 5 (five) minutes x 3 doses as needed for chest pain.   ranolazine 500 MG 12 hr tablet Commonly known as: RANEXA Take 1 tablet by mouth 2 (two) times daily.   tamsulosin 0.4 MG Caps capsule Commonly known as: FLOMAX Take 1 capsule (0.4 mg total) by mouth daily after supper.   traMADol 50 MG tablet Commonly known as: ULTRAM Take 1 tablet (50 mg total) by mouth every 6  (six) hours as needed for moderate pain (pain score 4-6) or severe pain (pain score 7-10).        Follow-up Information     Paraschos, Alexander, MD. Go in 1 week(s).   Specialty: Cardiology Contact information: [address] [facility] [place] [address] [phone]         Lauro Regulus, MD. Schedule an appointment as soon as possible for a visit in 1 week(s).   Specialty: Internal Medicine Contact information: [address] [place] [address] [phone]                Discharge Exam: Ceasar Mons Weights   04/16/23 0410 04/16/23 1923  Weight: 73.1 kg 72.4 kg   General.  Frail elderly man, in no acute distress. Pulmonary.  Lungs clear bilaterally, normal respiratory effort. CV.  Regular rate and rhythm, no JVD, rub or murmur. Abdomen.  Soft, nontender, nondistended, BS positive. CNS.  Alert and oriented .  No focal neurologic deficit. Extremities.  No edema, no cyanosis, pulses intact and symmetrical. Psychiatry.  Judgment and insight appears normal.   Condition at discharge: stable  The results of significant diagnostics from this hospitalization (including imaging, microbiology, ancillary and laboratory) are listed below for reference.   Imaging Studies: CARDIAC CATHETERIZATION Result Date: 04/21/2023   Prox RCA lesion is 40% stenosed.   Origin lesion is 100% stenosed.   Origin to Prox Graft lesion is 99% stenosed.   Prox Graft to Mid Graft lesion is 80% stenosed.   Ost Cx to Prox Cx lesion is 100% stenosed.   Prox LAD to Mid LAD lesion is 100% stenosed.   Ost LAD to Prox LAD lesion is 75% stenosed.   Previously placed Mid RCA to Dist RCA stent of unknown type is  widely patent.   There is severe left ventricular systolic dysfunction.   The left ventricular ejection fraction is less than 25% by visual estimate. 1.  NSTEMI 2.  Severe three-vessel coronary artery disease, with 100% stenosis  proximal LAD, 100% stenosis ostial left circumflex, patent stent mid RCA 3.  Patent LIMA to LAD, patent SVG to OM1, occluded SVG to distal RCA, 99% stenosis ostium and 75% thrombotic stenosis proximal SVG to small caliber OM 2 4.  Severe dilated cardiomyopathy with estimated LVEF less than 25% Recommendations 1.  Medical therapy.  Defer intervention of SVG to small caliber OM 2 due to 1) patient no longer having chest pain off of heparin, 2) patient very likely had completed NSTEMI with troponin greater than 24,000, 3) technically challenging  intervention of 89 year old vein bypass graft, 4) risk of renal failure due to increased contrast dye required for technically challenging PCI, 5) risk reward low in light of patient's advanced age and severe dilated cardiomyopathy. 2.  Dual antiplatelet therapy for 1 year 3.  Good medical management of severe dilated cardiomyopathy 4.  Aggressive risk factor modification 5.  Possible discharge home later today  DG Chest Port 1 View Result Date: 04/17/2023 CLINICAL DATA:  Wheezing EXAM: PORTABLE CHEST 1 VIEW COMPARISON:  04/16/2023 FINDINGS: Postoperative changes in the mediastinum. Cardiac enlargement. No vascular congestion. Left perihilar infiltrate, likely pneumonia or possibly asymmetric edema. This appears similar to prior study although previous patches obscured visualization to this area on the prior study. No pleural effusion or pneumothorax. Mediastinal contours appear intact. Calcification of the aorta. Degenerative changes in the shoulders. IMPRESSION: Persistent finding of left perihilar infiltration. Cardiac enlargement. Electronically Signed   By: Burman Nieves M.D.   On: 04/17/2023 15:36   ECHOCARDIOGRAM COMPLETE Result Date: 04/17/2023    ECHOCARDIOGRAM REPORT   Patient Name:   RASHAWD LASKARIS Date of Exam: 04/16/2023 Medical Rec #:  [id number]         Height:       69.0 in Accession #:    [id number]        Weight:       161.2 lb Date of Birth:   1930/06/10        BSA:          1.885 m Patient Age:    89 years          BP:           112/64 mmHg Patient Gender: M                 HR:           83 bpm. Exam Location:  [facility] Procedure: 2D Echo, Cardiac Doppler, Color Doppler and Intracardiac            Opacification Agent (Both Spectral and Color Flow Doppler were            utilized during procedure). Indications:     Atrial Fibrillation  History:         Patient has no prior history of Echocardiogram examinations.                  Prior CABG, COPD, Signs/Symptoms:Syncope; Risk                  Factors:Hypertension. NSTEMI.  Sonographer:     Mikki Harbor Referring Phys:  [phone] Francoise Schaumann [place] Diagnosing Phys: Marcina Millard MD  Sonographer Comments: Image acquisition challenging due to COPD. IMPRESSIONS  1. Left ventricular ejection fraction, by estimation, is 35 to 40%. The left ventricle has moderately decreased function. The left ventricle demonstrates regional wall motion abnormalities (see scoring diagram/findings for description). Indeterminate diastolic filling due to E-A fusion.  2. Right ventricular systolic function is normal. The right ventricular size is normal. There is moderately elevated pulmonary artery systolic pressure.  3. The mitral valve is normal in structure. Moderate mitral valve regurgitation. No evidence of mitral stenosis.  4. The aortic valve is normal in structure. Aortic valve regurgitation is moderate. No aortic stenosis is present.  5. The inferior vena cava is normal in size with greater than 50% respiratory variability, suggesting right atrial pressure of 3 mmHg. FINDINGS  Left Ventricle: Left ventricular ejection fraction, by estimation, is 35 to 40%. The left  ventricle has moderately decreased function. The left ventricle demonstrates regional wall motion abnormalities. Definity contrast agent was given IV to delineate the left ventricular endocardial borders. Strain imaging was not performed. The left ventricular  internal cavity size was normal in size. There is no left ventricular hypertrophy. Indeterminate diastolic filling due to E-A fusion.  LV Wall Scoring: The apical lateral segment, apical septal segment, and apex are akinetic. The mid inferoseptal segment is hypokinetic. Right Ventricle: The right ventricular size is normal. No increase in right ventricular wall thickness. Right ventricular systolic function is normal. There is moderately elevated pulmonary artery systolic pressure. The tricuspid regurgitant velocity is 3.26 m/s, and with an assumed right atrial pressure of 8 mmHg, the estimated right ventricular systolic pressure is 50.5 mmHg. Left Atrium: Left atrial size was normal in size. Right Atrium: Right atrial size was normal in size. Pericardium: There is no evidence of pericardial effusion. Mitral Valve: The mitral valve is normal in structure. Moderate mitral valve regurgitation. No evidence of mitral valve stenosis. MV peak gradient, 6.2 mmHg. The mean mitral valve gradient is 2.0 mmHg. Tricuspid Valve: The tricuspid valve is normal in structure. Tricuspid valve regurgitation is mild . No evidence of tricuspid stenosis. Aortic Valve: The aortic valve is normal in structure. Aortic valve regurgitation is moderate. No aortic stenosis is present. Aortic valve mean gradient measures 2.7 mmHg. Aortic valve peak gradient measures 5.6 mmHg. Aortic valve area, by VTI measures 3.03 cm. Pulmonic Valve: The pulmonic valve was normal in structure. Pulmonic valve regurgitation is not visualized. No evidence of pulmonic stenosis. Aorta: The aortic root is normal in size and structure. Venous: The inferior vena cava is normal in size with greater than 50% respiratory variability, suggesting right atrial pressure of 3 mmHg. IAS/Shunts: No atrial level shunt detected by color flow Doppler. Additional Comments: 3D imaging was not performed.  LEFT VENTRICLE PLAX 2D LVIDd:         6.30 cm LVIDs:         4.65 cm LV PW:          1.30 cm LV IVS:        1.20 cm LVOT diam:     2.00 cm LV SV:         52 LV SV Index:   28 LVOT Area:     3.14 cm  RIGHT VENTRICLE RV Basal diam:  4.15 cm RV Mid diam:    4.30 cm LEFT ATRIUM              Index        RIGHT ATRIUM           Index LA diam:        5.40 cm  2.86 cm/m   RA Area:     26.10 cm LA Vol (A2C):   106.0 ml 56.24 ml/m  RA Volume:   91.40 ml  48.49 ml/m LA Vol (A4C):   106.0 ml 56.24 ml/m LA Biplane Vol: 106.0 ml 56.24 ml/m  AORTIC VALVE                    PULMONIC VALVE AV Area (Vmax):    2.96 cm     PV Vmax:       0.72 m/s AV Area (Vmean):   2.76 cm     PV Peak grad:  2.1 mmHg AV Area (VTI):     3.03 cm AV Vmax:           118.00  cm/s AV Vmean:          73.167 cm/s AV VTI:            0.173 m AV Peak Grad:      5.6 mmHg AV Mean Grad:      2.7 mmHg LVOT Vmax:         111.00 cm/s LVOT Vmean:        64.300 cm/s LVOT VTI:          0.167 m LVOT/AV VTI ratio: 0.97  AORTA Ao Root diam: 3.90 cm MITRAL VALVE                TRICUSPID VALVE MV Area (PHT): 4.49 cm     TR Peak grad:   42.5 mmHg MV Area VTI:   1.62 cm     TR Vmax:        326.00 cm/s MV Peak grad:  6.2 mmHg MV Mean grad:  2.0 mmHg     SHUNTS MV Vmax:       1.25 m/s     Systemic VTI:  0.17 m MV Vmean:      55.3 cm/s    Systemic Diam: 2.00 cm MV Decel Time: 169 msec MV E velocity: 106.00 cm/s Marcina Millard MD Electronically signed by Marcina Millard MD Signature Date/Time: 04/17/2023/1:07:11 PM    Final    CT CHEST WO CONTRAST Result Date: 04/16/2023 CLINICAL DATA:  Chest pain.  Syncope. EXAM: CT CHEST WITHOUT CONTRAST TECHNIQUE: Multidetector CT imaging of the chest was performed following the standard protocol without IV contrast. RADIATION DOSE REDUCTION: This exam was performed according to the departmental dose-optimization program which includes automated exposure control, adjustment of the mA and/or kV according to patient size and/or use of iterative reconstruction technique. COMPARISON:  Chest radiograph dated  04/16/2023. FINDINGS: Cardiovascular: The heart is enlarged. Prior CABG. Coronary artery calcifications. Nonaneurysmal aorta. Atherosclerotic calcifications of the thoracic aorta and arch branch vessels. Mediastinum/Nodes: There are several prominent and a few mildly enlarged mediastinal lymph nodes including a 11 mm right paratracheal lymph node and a 12 mm subcarinal lymph node. No enlarged axillary lymph nodes. Trachea and esophagus are unremarkable. Lungs/Pleura: Patchy ground-glass and consolidative opacities with a central peribronchovascular distribution throughout the left lung, most pronounced in the left upper lobe. Less pronounced patchy ground-glass opacities with a predominantly central peribronchovascular distribution in the right lung. Bilateral left-greater-than-right interlobular septal thickening. Trace left pleural effusion. No pneumothorax. Upper Abdomen: Trace upper abdominal ascites. Partially visualized 7 cm cyst arising from the superior pole of the left kidney. Musculoskeletal: No chest wall mass or suspicious bone lesions identified. Prior median sternotomy with discontinuity of multiple cerclage wires again noted. IMPRESSION: 1. Patchy ground-glass and consolidative opacities with a central peribronchovascular distribution throughout the left lung and less pronounced patchy opacities within the right lung with interlobular septal thickening and trace left pleural effusion. These findings are favored to represent cardiogenic pulmonary edema, although, the presence of an underlying infiltrate can not be excluded. 2. Mildly enlarged and prominent mediastinal lymph nodes are likely reactive. 3. Cardiomegaly. 4. Trace upper abdominal ascites. 5. Aortic Atherosclerosis (ICD10-I70.0). Electronically Signed   By: Hart Robinsons M.D.   On: 04/16/2023 09:34   DG Chest Port 1 View Result Date: 04/16/2023 CLINICAL DATA:  Awoke with chest pain EXAM: PORTABLE CHEST 1 VIEW COMPARISON:  12/13/2019  FINDINGS: Stable borderline heart size.  Prior CABG. Asymmetric interstitial and airspace opacity to the left lung which is significantly obscured by overlapping  hardware. No visible pleural effusion or pneumothorax. IMPRESSION: Asymmetric edema versus pneumonia in the left lung. Electronically Signed   By: Tiburcio Pea M.D.   On: 04/16/2023 05:41   CT Head Wo Contrast Result Date: 04/16/2023 CLINICAL DATA:  Nausea and neck trauma. EXAM: CT HEAD WITHOUT CONTRAST CT CERVICAL SPINE WITHOUT CONTRAST TECHNIQUE: Multidetector CT imaging of the head and cervical spine was performed following the standard protocol without intravenous contrast. Multiplanar CT image reconstructions of the cervical spine were also generated. RADIATION DOSE REDUCTION: This exam was performed according to the departmental dose-optimization program which includes automated exposure control, adjustment of the mA and/or kV according to patient size and/or use of iterative reconstruction technique. COMPARISON:  None Available. FINDINGS: CT HEAD FINDINGS Brain: No evidence of acute infarction, hemorrhage, hydrocephalus, extra-axial collection or mass lesion/mass effect. Chronic small vessel disease in the cerebral white matter that is mild. 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 Vascular: No hyperdense vessel or unexpected calcification. Skull: Normal. Negative for fracture or focal lesion. Sinuses/Orbits: No evidence of injury. CT CERVICAL SPINE FINDINGS Alignment: No traumatic malalignment Skull base and vertebrae: No acute fracture.  Subjective osteopenia Soft tissues and spinal canal: No prevertebral fluid or swelling. No visible canal hematoma. Disc levels: Generalized degenerative endplate and facet spurring in keeping with age. Upper chest: Partially covered airspace disease at the left apex. IMPRESSION: 1. No evidence of acute intracranial or cervical spine injury. 2. Partially covered left apical opacity, correlate with  pending chest radiograph. Electronically Signed   By: Tiburcio Pea M.D.   On: 04/16/2023 05:18   CT Cervical Spine Wo Contrast Result Date: 04/16/2023 CLINICAL DATA:  Nausea and neck trauma. EXAM: CT HEAD WITHOUT CONTRAST CT CERVICAL SPINE WITHOUT CONTRAST TECHNIQUE: Multidetector CT imaging of the head and cervical spine was performed following the standard protocol without intravenous contrast. Multiplanar CT image reconstructions of the cervical spine were also generated. RADIATION DOSE REDUCTION: This exam was performed according to the departmental dose-optimization program which includes automated exposure control, adjustment of the mA and/or kV according to patient size and/or use of iterative reconstruction technique. COMPARISON:  None Available. FINDINGS: CT HEAD FINDINGS Brain: No evidence of acute infarction, hemorrhage, hydrocephalus, extra-axial collection or mass lesion/mass effect. Chronic small vessel disease in the cerebral white matter that is mild. 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 Vascular: No hyperdense vessel or unexpected calcification. Skull: Normal. Negative for fracture or focal lesion. Sinuses/Orbits: No evidence of injury. CT CERVICAL SPINE FINDINGS Alignment: No traumatic malalignment Skull base and vertebrae: No acute fracture.  Subjective osteopenia Soft tissues and spinal canal: No prevertebral fluid or swelling. No visible canal hematoma. Disc levels: Generalized degenerative endplate and facet spurring in keeping with age. Upper chest: Partially covered airspace disease at the left apex. IMPRESSION: 1. No evidence of acute intracranial or cervical spine injury. 2. Partially covered left apical opacity, correlate with pending chest radiograph. Electronically Signed   By: Tiburcio Pea M.D.   On: 04/16/2023 05:18    Microbiology: Results for orders placed or performed during the hospital encounter of 04/16/23  C Difficile Quick Screen w PCR reflex      Status: None   Collection Time: 04/17/23  8:40 AM   Specimen: STOOL  Result Value Ref Range Status   C Diff antigen NEGATIVE NEGATIVE Final   C Diff toxin NEGATIVE NEGATIVE Final   C Diff interpretation No C. difficile detected.  Final    Comment: Performed at [facility], [phone]  [address] [place], [place] [address]    Labs: CBC: Recent Labs  Lab 04/16/23 0421 04/17/23 0447 04/18/23 0710 04/19/23 0454  WBC 10.1 8.2 6.4 6.9  NEUTROABS 9.1* 6.4  --   --   HGB 12.7* 12.2* 11.3* 11.1*  HCT 38.2* 35.7* 33.3* 33.1*  MCV 97.2 93.9 93.8 95.4  PLT 201 179 181 192   Basic Metabolic Panel: Recent Labs  Lab 04/16/23 0421 04/17/23 0447 04/18/23 0710 04/19/23 0454  NA 135 133* 133* 135  K 3.9 3.9 3.4* 3.3*  CL 102 103 102 104  CO2 20* 19* 19* 21*  GLUCOSE 273* 114* 142* 121*  BUN 21 36* 38* 36*  CREATININE 1.72* 2.02* 1.51* 1.27*  CALCIUM 8.4* 8.1* 7.9* 8.1*  MG  --   --   --  2.3   Liver Function Tests: Recent Labs  Lab 04/16/23 0421 04/17/23 0447  AST 32 122*  ALT 19 35  ALKPHOS 50 40  BILITOT 2.4* 1.7*  PROT 6.1* 5.7*  ALBUMIN 3.6 3.3*   CBG: No results for input(s): "GLUCAP" in the last 168 hours.  Discharge time spent: greater than 30 minutes.  This record has been created using Conservation officer, historic buildings. Errors have been sought and corrected,but may not always be located. Such creation errors do not reflect on the standard of care.   Signed: Arnetha Courser, MD [facility] 04/22/2023

## 2023-04-22 NOTE — TOC Transition Note (Signed)
 Transition of Care [facility]) - Discharge Note   Patient Details  Name: Jesus Stuart MRN: [id number] Date of Birth: Mar 01, 1930  Transition of [facility]) CM/SW Contact:  Margarito Liner, LCSW Phone Number: 04/22/2023, 11:50 AM   Clinical Narrative:   Patient has orders to discharge home today. Per MD, he is now agreeable to home health. CSW met with patient. Family at bedside. CSW provided CMS scores for agencies that serve his zip code. Preference is [facility]. They have accepted referral and will start services tomorrow. No further concerns. CSW signing off.  Final next level of care: Home w Home Health Services Barriers to Discharge: No Barriers Identified   Patient Goals and CMS Choice            Discharge Placement                Patient to be transferred to facility by: Family   Patient and family notified of of transfer: 04/22/23  Discharge Plan and Services Additional resources added to the After Visit Summary for                            [facility] Arranged: RN, PT, OT [facility] Agency: [facility] Date [facility] Agency Contacted: 04/22/23   Representative spoke with at [facility] Agency: Shanda Bumps  Social Drivers of Health (SDOH) Interventions SDOH Screenings   Food Insecurity: No Food Insecurity (04/16/2023)  Housing: Low Risk  (04/16/2023)  Transportation Needs: No Transportation Needs (04/16/2023)  Utilities: Not At Risk (04/16/2023)  Financial Resource Strain: Low Risk  (03/13/2023)   Received from [facility]  Social Connections: Socially Integrated (04/16/2023)  Tobacco Use: Medium Risk (04/16/2023)     Readmission Risk Interventions     No data to display

## 2023-04-22 NOTE — TOC CM/SW Note (Signed)
 Transition of Care [facility]) - Inpatient Brief Assessment   Patient Details  Name: Jesus Stuart MRN: [id number] Date of Birth: [birth year]  Transition of Care [facility]) CM/SW Contact:    Margarito Liner, LCSW Phone Number: 04/22/2023, 10:17 AM   Clinical Narrative: CSW reviewed chart. No TOC needs identified at this time. CSW will continue to follow progress. Please place [facility] consult if any needs arise.  Transition of Care Asessment: Insurance and Status: Insurance coverage has been reviewed Patient has primary care physician: Yes Home environment has been reviewed: Single family home Prior level of function:: Independent Prior/Current Home Services: No current home services Social Drivers of Health Review: SDOH reviewed no interventions necessary Readmission risk has been reviewed: Yes Transition of care needs: no transition of care needs at this time

## 2023-05-20 DEATH — deceased
